# Patient Record
Sex: Male | Born: 1955 | Race: White | Hispanic: No | Marital: Married | Smoking: Never smoker
Health system: Southern US, Community
[De-identification: ages and names within clinical notes are randomized; demographics above are authoritative.]

## PROBLEM LIST (undated history)

## (undated) DIAGNOSIS — N400 Enlarged prostate without lower urinary tract symptoms: Secondary | ICD-10-CM

## (undated) DIAGNOSIS — H409 Unspecified glaucoma: Secondary | ICD-10-CM

## (undated) DIAGNOSIS — Z87442 Personal history of urinary calculi: Secondary | ICD-10-CM

## (undated) DIAGNOSIS — E7800 Pure hypercholesterolemia, unspecified: Secondary | ICD-10-CM

## (undated) DIAGNOSIS — K439 Ventral hernia without obstruction or gangrene: Secondary | ICD-10-CM

## (undated) DIAGNOSIS — R7303 Prediabetes: Secondary | ICD-10-CM

## (undated) DIAGNOSIS — E119 Type 2 diabetes mellitus without complications: Secondary | ICD-10-CM

## (undated) HISTORY — PX: COLONOSCOPY: SHX174

## (undated) HISTORY — PX: APPENDECTOMY: SHX54

## (undated) HISTORY — PX: EYE SURGERY: SHX253

## (undated) HISTORY — PX: HERNIA REPAIR: SHX51

---

## 2001-04-05 ENCOUNTER — Encounter: Admission: RE | Admit: 2001-04-05 | Discharge: 2001-04-05 | Payer: Self-pay | Admitting: Infectious Diseases

## 2004-12-18 ENCOUNTER — Ambulatory Visit: Payer: Self-pay | Admitting: Unknown Physician Specialty

## 2008-05-20 ENCOUNTER — Ambulatory Visit: Payer: Self-pay | Admitting: Internal Medicine

## 2008-05-25 ENCOUNTER — Ambulatory Visit: Payer: Self-pay | Admitting: Orthopedic Surgery

## 2009-10-08 ENCOUNTER — Ambulatory Visit: Payer: Self-pay | Admitting: Unknown Physician Specialty

## 2009-12-20 ENCOUNTER — Emergency Department: Payer: Self-pay | Admitting: Emergency Medicine

## 2010-08-06 ENCOUNTER — Ambulatory Visit: Payer: Self-pay | Admitting: Urology

## 2010-09-12 ENCOUNTER — Ambulatory Visit: Payer: Self-pay | Admitting: Urology

## 2011-12-16 DIAGNOSIS — Z87442 Personal history of urinary calculi: Secondary | ICD-10-CM

## 2011-12-16 HISTORY — PX: EXTRACORPOREAL SHOCK WAVE LITHOTRIPSY: SHX1557

## 2011-12-16 HISTORY — DX: Personal history of urinary calculi: Z87.442

## 2012-02-19 ENCOUNTER — Ambulatory Visit: Payer: Self-pay | Admitting: Urology

## 2012-03-05 ENCOUNTER — Ambulatory Visit: Payer: Self-pay | Admitting: Internal Medicine

## 2012-10-25 ENCOUNTER — Ambulatory Visit: Payer: Self-pay

## 2013-05-04 DIAGNOSIS — H401190 Primary open-angle glaucoma, unspecified eye, stage unspecified: Secondary | ICD-10-CM | POA: Insufficient documentation

## 2014-06-01 DIAGNOSIS — R739 Hyperglycemia, unspecified: Secondary | ICD-10-CM | POA: Insufficient documentation

## 2014-06-01 DIAGNOSIS — N400 Enlarged prostate without lower urinary tract symptoms: Secondary | ICD-10-CM | POA: Insufficient documentation

## 2016-07-21 DIAGNOSIS — E119 Type 2 diabetes mellitus without complications: Secondary | ICD-10-CM | POA: Insufficient documentation

## 2016-07-21 HISTORY — DX: Type 2 diabetes mellitus without complications: E11.9

## 2017-08-04 ENCOUNTER — Encounter: Payer: Self-pay | Admitting: *Deleted

## 2017-08-05 MED ORDER — LEVOFLOXACIN 500 MG PO TABS
500.0000 mg | ORAL_TABLET | ORAL | Status: AC
  Administered 2017-08-06: 500 mg via ORAL

## 2017-08-06 ENCOUNTER — Ambulatory Visit
Admission: RE | Admit: 2017-08-06 | Discharge: 2017-08-06 | Disposition: A | Discharge status: Home / Self Care | Payer: Commercial Managed Care - PPO | Source: Ambulatory Visit | Attending: Urology | Admitting: Urology

## 2017-08-06 ENCOUNTER — Encounter
Admission: RE | Disposition: A | Discharge status: Home / Self Care | Payer: Self-pay | Source: Ambulatory Visit | Attending: Urology

## 2017-08-06 DIAGNOSIS — N400 Enlarged prostate without lower urinary tract symptoms: Secondary | ICD-10-CM | POA: Diagnosis not present

## 2017-08-06 DIAGNOSIS — N201 Calculus of ureter: Secondary | ICD-10-CM | POA: Diagnosis not present

## 2017-08-06 DIAGNOSIS — Z7984 Long term (current) use of oral hypoglycemic drugs: Secondary | ICD-10-CM | POA: Insufficient documentation

## 2017-08-06 DIAGNOSIS — Z79899 Other long term (current) drug therapy: Secondary | ICD-10-CM | POA: Diagnosis not present

## 2017-08-06 DIAGNOSIS — E119 Type 2 diabetes mellitus without complications: Secondary | ICD-10-CM | POA: Diagnosis not present

## 2017-08-06 DIAGNOSIS — Z9049 Acquired absence of other specified parts of digestive tract: Secondary | ICD-10-CM | POA: Diagnosis not present

## 2017-08-06 DIAGNOSIS — H409 Unspecified glaucoma: Secondary | ICD-10-CM | POA: Insufficient documentation

## 2017-08-06 HISTORY — DX: Personal history of urinary calculi: Z87.442

## 2017-08-06 HISTORY — DX: Unspecified glaucoma: H40.9

## 2017-08-06 HISTORY — DX: Benign prostatic hyperplasia without lower urinary tract symptoms: N40.0

## 2017-08-06 HISTORY — PX: EXTRACORPOREAL SHOCK WAVE LITHOTRIPSY: SHX1557

## 2017-08-06 HISTORY — DX: Type 2 diabetes mellitus without complications: E11.9

## 2017-08-06 LAB — GLUCOSE, CAPILLARY: GLUCOSE-CAPILLARY: 117 mg/dL — AB (ref 65–99)

## 2017-08-06 SURGERY — LITHOTRIPSY, ESWL
Anesthesia: Moderate Sedation | Laterality: Left

## 2017-08-06 MED ORDER — DIPHENHYDRAMINE HCL 25 MG PO CAPS
ORAL_CAPSULE | ORAL | Status: AC
  Administered 2017-08-06: 25 mg
  Filled 2017-08-06: qty 1

## 2017-08-06 MED ORDER — PROMETHAZINE HCL 25 MG/ML IJ SOLN
INTRAMUSCULAR | Status: AC
  Administered 2017-08-06: 25 mg via INTRAMUSCULAR
  Filled 2017-08-06: qty 1

## 2017-08-06 MED ORDER — FUROSEMIDE 10 MG/ML IJ SOLN
INTRAMUSCULAR | Status: AC
  Administered 2017-08-06: 1 mg via INTRAVENOUS
  Filled 2017-08-06: qty 2

## 2017-08-06 MED ORDER — DIPHENHYDRAMINE HCL 25 MG PO CAPS: 25.0000 mg | ORAL_CAPSULE | ORAL | Status: DC

## 2017-08-06 MED ORDER — ONDANSETRON 8 MG PO TBDP: 4.0000 mg | ORAL_TABLET | Freq: Four times a day (QID) | ORAL | 3 refills | Status: DC | PRN

## 2017-08-06 MED ORDER — MIDAZOLAM HCL 2 MG/2ML IJ SOLN
INTRAMUSCULAR | Status: AC
  Administered 2017-08-06: 1 mg via INTRAMUSCULAR
  Filled 2017-08-06: qty 2

## 2017-08-06 MED ORDER — MIDAZOLAM HCL 2 MG/2ML IJ SOLN
1.0000 mg | Freq: Once | INTRAMUSCULAR | Status: AC
  Administered 2017-08-06: 1 mg via INTRAMUSCULAR

## 2017-08-06 MED ORDER — MORPHINE SULFATE (PF) 10 MG/ML IV SOLN
INTRAVENOUS | Status: AC
  Administered 2017-08-06: 10 mg via INTRAMUSCULAR
  Filled 2017-08-06: qty 1

## 2017-08-06 MED ORDER — FUROSEMIDE 10 MG/ML IJ SOLN
10.0000 mg | Freq: Once | INTRAMUSCULAR | Status: AC
  Administered 2017-08-06: 1 mg via INTRAVENOUS

## 2017-08-06 MED ORDER — LEVOFLOXACIN 500 MG PO TABS
ORAL_TABLET | ORAL | Status: AC
  Administered 2017-08-06: 500 mg via ORAL
  Filled 2017-08-06: qty 1

## 2017-08-06 MED ORDER — DEXTROSE-NACL 5-0.45 % IV SOLN
INTRAVENOUS | Status: DC
  Administered 2017-08-06: 12:00:00 via INTRAVENOUS

## 2017-08-06 MED ORDER — NUCYNTA 50 MG PO TABS: 50.0000 mg | ORAL_TABLET | Freq: Four times a day (QID) | ORAL | 0 refills | Status: DC | PRN

## 2017-08-06 MED ORDER — MORPHINE SULFATE (PF) 10 MG/ML IV SOLN
10.0000 mg | Freq: Once | INTRAVENOUS | Status: AC
  Administered 2017-08-06: 10 mg via INTRAMUSCULAR

## 2017-08-06 MED ORDER — CIPROFLOXACIN HCL 500 MG PO TABS: 500.0000 mg | ORAL_TABLET | Freq: Two times a day (BID) | ORAL | 0 refills | Status: DC

## 2017-08-06 MED ORDER — PROMETHAZINE HCL 25 MG/ML IJ SOLN
25.0000 mg | Freq: Once | INTRAMUSCULAR | Status: AC
  Administered 2017-08-06: 25 mg via INTRAMUSCULAR

## 2017-08-06 NOTE — Discharge Instructions (Signed)
Kidney Stones Kidney stones (urolithiasis) are rock-like masses that form inside of the kidneys. Kidneys are organs that make pee (urine). A kidney stone can cause very bad pain and can block the flow of pee. The stone usually leaves your body (passes) through your pee. You may need to have a doctor take out the stone. Follow these instructions at home: Eating and drinking  Drink enough fluid to keep your pee clear or pale yellow. This will help you pass the stone.  If told by your doctor, change the foods you eat (your diet). This may include: ? Limiting how much salt (sodium) you eat. ? Eating more fruits and vegetables. ? Limiting how much meat, poultry, fish, and eggs you eat.  Follow instructions from your doctor about eating or drinking restrictions. General instructions  Collect pee samples as told by your doctor. You may need to collect a pee sample: ? 24 hours after a stone comes out. ? 8-12 weeks after a stone comes out, and every 6-12 months after that.  Strain your pee every time you pee (urinate), for as long as told. Use the strainer that your doctor recommends.  Do not throw out the stone. Keep it so that it can be tested by your doctor.  Take over-the-counter and prescription medicines only as told by your doctor.  Keep all follow-up visits as told by your doctor. This is important. You may need follow-up tests. Preventing kidney stones To prevent another kidney stone:  Drink enough fluid to keep your pee clear or pale yellow. This is the best way to prevent kidney stones.  Eat healthy foods.  Avoid certain foods as told by your doctor. You may be told to eat less protein.  Stay at a healthy weight.  Contact a doctor if:  You have pain that gets worse or does not get better with medicine. Get help right away if:  You have a fever or chills.  You get very bad pain.  You get new pain in your belly (abdomen).  You pass out (faint).  You cannot pee. This  information is not intended to replace advice given to you by your health care provider. Make sure you discuss any questions you have with your health care provider. Document Released: 05/19/2008 Document Revised: 08/19/2016 Document Reviewed: 08/19/2016 Elsevier Interactive Patient Education  2017 [employer]   Kidney Stones Kidney stones (urolithiasis) are solid, rock-like deposits that form inside of the organs that make urine (kidneys). A kidney stone may form in a kidney and move into the bladder, where it can cause intense pain and block the flow of urine. Kidney stones are created when high levels of certain minerals are found in the urine. They are usually passed through urination, but in some cases, medical treatment may be needed to remove them. What are the causes? Kidney stones may be caused by:  A condition in which certain glands produce too much parathyroid hormone (primary hyperparathyroidism), which causes too much calcium buildup in the blood.  Buildup of uric acid crystals in the bladder (hyperuricosuria). Uric acid is a chemical that the body produces when you eat certain foods. It usually exits the body in the urine.  Narrowing (stricture) of one or both of the tubes that drain urine from the kidneys to the bladder (ureters).  A kidney blockage that is present at birth (congenital obstruction).  Past surgery on the kidney or the ureters, such as gastric bypass surgery.  What increases the risk? The following  factors make you more likely to develop kidney stones:  Having had a kidney stone in the past.  Having a family history of kidney stones.  Not drinking enough water.  Eating a diet that is high in protein, salt (sodium), or sugar.  Being overweight or obese.  What are the signs or symptoms? Symptoms of a kidney stone may include:  Nausea.  Vomiting.  Blood in the urine (hematuria).  Pain in the side of the abdomen, right below the ribs (flank  pain). Pain usually spreads (radiates) to the groin.  Needing to urinate frequently or urgently.  How is this diagnosed? This condition may be diagnosed based on:  Your medical history.  A physical exam.  Blood tests.  Urine tests.  CT scan.  Abdominal X-ray.  A procedure to examine the inside of the bladder (cystoscopy).  How is this treated? Treatment for kidney stones depends on the size, location, and makeup of the stones. Treatment may involve:  Analyzing your urine before and after you pass the stone through urination.  Being monitored at the hospital until you pass the stone through urination.  Increasing your fluid intake and decreasing the amount of calcium and protein in your diet.  A procedure to break up kidney stones in the bladder using: ? A focused beam of light (laser therapy). ? Shock waves (extracorporeal shock wave lithotripsy).  Surgery to remove kidney stones. This may be needed if you have severe pain or have stones that block your urinary tract.  Follow these instructions at home: Eating and drinking   Drink enough fluid to keep your urine clear or pale yellow. This will help you to pass the kidney stone.  If directed, change your diet. This may include: ? Limiting how much sodium you eat. ? Eating more fruits and vegetables. ? Limiting how much meat, poultry, fish, and eggs you eat.  Follow instructions from your health care provider about eating or drinking restrictions. General instructions  Collect urine samples as told by your health care provider. You may need to collect a urine sample: ? 24 hours after you pass the stone. ? 8-12 weeks after passing the kidney stone, and every 6-12 months after that.  Strain your urine every time you urinate, for as long as directed. Use the strainer that your health care provider recommends.  Do not throw out the kidney stone after passing it. Keep the stone so it can be tested by your health care  provider. Testing the makeup of your kidney stone may help prevent you from getting kidney stones in the future.  Take over-the-counter and prescription medicines only as told by your health care provider.  Keep all follow-up visits as told by your health care provider. This is important. You may need follow-up X-rays or ultrasounds to make sure that your stone has passed. How is this prevented? To prevent another kidney stone:  Drink enough fluid to keep your urine clear or pale yellow. This is the best way to prevent kidney stones.  Eat a healthy diet and follow recommendations from your health care provider about foods to avoid. You may be instructed to eat a low-protein diet. Recommendations vary depending on the type of kidney stone that you have.  Maintain a healthy weight.  Contact a health care provider if:  You have pain that gets worse or does not get better with medicine. Get help right away if:  You have a fever or chills.  You develop severe pain.  You develop new abdominal pain.  You faint.  You are unable to urinate. This information is not intended to replace advice given to you by your health care provider. Make sure you discuss any questions you have with your health care provider. Document Released: 12/01/2005 Document Revised: 06/20/2016 Document Reviewed: 05/16/2016 Elsevier Interactive Patient Education  2017 [employer]   Lithotripsy Lithotripsy is a treatment that can sometimes help eliminate kidney stones and the pain that they cause. A form of lithotripsy, also known as extracorporeal shock wave lithotripsy, is a nonsurgical procedure that crushes a kidney stone with shock waves. These shock waves pass through your body and focus on the kidney stone. They cause the kidney stone to break up while it is still in the urinary tract. This makes it easier for the smaller pieces of stone to pass in the urine. Tell a health care provider about:  Any allergies  you have.  All medicines you are taking, including vitamins, herbs, eye drops, creams, and over-the-counter medicines.  Any blood disorders you have.  Any surgeries you have had.  Any medical conditions you have.  Whether you are pregnant or may be pregnant.  Any problems you or family members have had with anesthetic medicines. What are the risks? Generally, this is a safe procedure. However, problems may occur, including:  Infection.  Bleeding of the kidney.  Bruising of the kidney or skin.  Scarring of the kidney, which can lead to: ? Increased blood pressure. ? Poor kidney function. ? Return (recurrence) of kidney stones.  Damage to other structures or organs, such as the liver, colon, spleen, or pancreas.  Blockage (obstruction) of the the tube that carries urine from the kidney to the bladder (ureter).  Failure of the kidney stone to break into pieces (fragments).  What happens before the procedure? Staying hydrated Follow instructions from your health care provider about hydration, which may include:  Up to 2 hours before the procedure - you may continue to drink clear liquids, such as water, clear fruit juice, black coffee, and plain tea.  Eating and drinking restrictions Follow instructions from your health care provider about eating and drinking, which may include:  8 hours before the procedure - stop eating heavy meals or foods such as meat, fried foods, or fatty foods.  6 hours before the procedure - stop eating light meals or foods, such as toast or cereal.  6 hours before the procedure - stop drinking milk or drinks that contain milk.  2 hours before the procedure - stop drinking clear liquids.  General instructions  Plan to have someone take you home from the hospital or clinic.  Ask your health care provider about: ? Changing or stopping your regular medicines. This is especially important if you are taking diabetes medicines or blood  thinners. ? Taking medicines such as aspirin and ibuprofen. These medicines and other NSAIDs can thin your blood. Do not take these medicines for 7 days before your procedure if your health care provider instructs you not to.  You may have tests, such as: ? Blood tests. ? Urine tests. ? Imaging tests, such as a CT scan. What happens during the procedure?  To lower your risk of infection: ? Your health care team will wash or sanitize their hands. ? Your skin will be washed with soap.  An IV tube will be inserted into one of your veins. This tube will give you fluids and medicines.  You will be given one or more  of the following: ? A medicine to help you relax (sedative). ? A medicine to make you fall asleep (general anesthetic).  A water-filled cushion may be placed behind your kidney or on your abdomen. In some cases you may be placed in a tub of lukewarm water.  Your body will be positioned in a way that makes it easy to target the kidney stone.  A flexible tube with holes in it (stent) may be placed in the ureter. This will help keep urine flowing from the kidney if the fragments of the stone have been blocking the ureter.  An X-ray or ultrasound exam will be done to locate your stone.  Shock waves will be aimed at the stone. If you are awake, you may feel a tapping sensation as the shock waves pass through your body. The procedure may vary among health care providers and hospitals. What happens after the procedure?  You may have an X-ray to see whether the procedure was able to break up the kidney stone and how much of the stone has passed. If large stone fragments remain after treatment, you may need to have a second procedure at a later time.  Your blood pressure, heart rate, breathing rate, and blood oxygen level will be monitored until the medicines you were given have worn off.  You may be given antibiotics or pain medicine as needed.  If a stent was placed in your ureter  during surgery, it may stay in place for a few weeks.  You may need strain your urine to collect pieces of the kidney stone for testing.  You will need to drink plenty of water.  Do not drive for 24 hours if you were given a sedative. Summary  Lithotripsy is a treatment that can sometimes help eliminate kidney stones and the pain that they cause.  A form of lithotripsy, also known as extracorporeal shock wave lithotripsy, is a nonsurgical procedure that crushes a kidney stone with shock waves.  Generally, this is a safe procedure. However, problems may occur, including damage to the kidney or other organs, infection, or obstruction of the tube that carries urine from the kidney to the bladder (ureter).  When you go home, you will need to drink plenty of water. You may be asked to strain your urine to collect pieces of the kidney stone for testing. This information is not intended to replace advice given to you by your health care provider. Make sure you discuss any questions you have with your health care provider. Document Released: 11/28/2000 Document Revised: 10/22/2016 Document Reviewed: 10/22/2016 Elsevier Interactive Patient Education  2017 [employer]   Lithotripsy, Care After This sheet gives you information about how to care for yourself after your procedure. Your health care provider may also give you more specific instructions. If you have problems or questions, contact your health care provider. What can I expect after the procedure? After the procedure, it is common to have:  Some blood in your urine. This should only last for a few days.  Soreness in your back, sides, or upper abdomen for a few days.  Blotches or bruises on your back where the pressure wave entered the skin.  Pain, discomfort, or nausea when pieces (fragments) of the kidney stone move through the tube that carries urine from the kidney to the bladder (ureter). Stone fragments may pass soon after the  procedure, but they may continue to pass for up to 4-8 weeks. ? If you have severe pain or  nausea, contact your health care provider. This may be caused by a large stone that was not broken up, and this may mean that you need more treatment.  Some pain or discomfort during urination.  Some pain or discomfort in the lower abdomen or (in men) at the base of the penis.  Follow these instructions at home: Medicines  Take over-the-counter and prescription medicines only as told by your health care provider.  If you were prescribed an antibiotic medicine, take it as told by your health care provider. Do not stop taking the antibiotic even if you start to feel better.  Do not drive for 24 hours if you were given a medicine to help you relax (sedative).  Do not drive or use heavy machinery while taking prescription pain medicine. Eating and drinking  Drink enough water and fluids to keep your urine clear or pale yellow. This helps any remaining pieces of the stone to pass. It can also help prevent new stones from forming.  Eat plenty of fresh fruits and vegetables.  Follow instructions from your health care provider about eating and drinking restrictions. You may be instructed: ? To reduce how much salt (sodium) you eat or drink. Check ingredients and nutrition facts on packaged foods and beverages. ? To reduce how much meat you eat.  Eat the recommended amount of calcium for your age and gender. Ask your health care provider how much calcium you should have. General instructions  Get plenty of rest.  Most people can resume normal activities 1-2 days after the procedure. Ask your health care provider what activities are safe for you.  If directed, strain all urine through the strainer that was provided by your health care provider. ? Keep all fragments for your health care provider to see. Any stones that are found may be sent to a medical lab for examination. The stone may be as small as  a grain of salt.  Keep all follow-up visits as told by your health care provider. This is important. Contact a health care provider if:  You have pain that is severe or does not get better with medicine.  You have nausea that is severe or does not go away.  You have blood in your urine longer than your health care provider told you to expect.  You have more blood in your urine.  You have pain during urination that does not go away.  You urinate more frequently than usual and this does not go away.  You develop a rash or any other possible signs of an allergic reaction. Get help right away if:  You have severe pain in your back, sides, or upper abdomen.  You have severe pain while urinating.  Your urine is very dark red.  You have blood in your stool (feces).  You cannot pass any urine at all.  You feel a strong urge to urinate after emptying your bladder.  You have a fever or chills.  You develop shortness of breath, difficulty breathing, or chest pain.  You have severe nausea that leads to persistent vomiting.  You faint. Summary  After this procedure, it is common to have some pain, discomfort, or nausea when pieces (fragments) of the kidney stone move through the tube that carries urine from the kidney to the bladder (ureter). If this pain or nausea is severe, however, you should contact your health care provider.  Most people can resume normal activities 1-2 days after the procedure. Ask your health care provider  what activities are safe for you.  Drink enough water and fluids to keep your urine clear or pale yellow. This helps any remaining pieces of the stone to pass, and it can help prevent new stones from forming.  If directed, strain your urine and keep all fragments for your health care provider to see. Fragments or stones may be as small as a grain of salt.  Get help right away if you have severe pain in your back, sides, or upper abdomen or have severe  pain while urinating. This information is not intended to replace advice given to you by your health care provider. Make sure you discuss any questions you have with your health care provider. Document Released: 12/21/2007 Document Revised: 10/22/2016 Document Reviewed: 10/22/2016 Elsevier Interactive Patient Education  2017 [employer].

## 2017-08-06 NOTE — OR Nursing (Signed)
Transported to to [facility] truck via wheelchair.

## 2017-08-07 ENCOUNTER — Encounter: Payer: Self-pay | Admitting: Urology

## 2018-02-09 DIAGNOSIS — N200 Calculus of kidney: Secondary | ICD-10-CM | POA: Insufficient documentation

## 2019-01-06 ENCOUNTER — Other Ambulatory Visit: Payer: Self-pay | Admitting: Urology

## 2019-01-06 DIAGNOSIS — R1032 Left lower quadrant pain: Secondary | ICD-10-CM

## 2019-01-12 ENCOUNTER — Ambulatory Visit: Admission: RE | Admit: 2019-01-12 | Payer: Commercial Managed Care - PPO | Source: Ambulatory Visit

## 2019-01-13 ENCOUNTER — Ambulatory Visit
Admission: RE | Admit: 2019-01-13 | Discharge: 2019-01-13 | Disposition: A | Discharge status: Home / Self Care | Payer: Commercial Managed Care - PPO | Source: Ambulatory Visit | Attending: Urology | Admitting: Urology

## 2019-01-13 DIAGNOSIS — R1032 Left lower quadrant pain: Secondary | ICD-10-CM | POA: Diagnosis not present

## 2019-01-13 LAB — POCT I-STAT CREATININE: Creatinine, Ser: 0.8 mg/dL (ref 0.61–1.24)

## 2019-01-13 MED ORDER — IOPAMIDOL (ISOVUE-300) INJECTION 61%
100.0000 mL | Freq: Once | INTRAVENOUS | Status: AC | PRN
  Administered 2019-01-13: 100 mL via INTRAVENOUS

## 2019-03-06 ENCOUNTER — Ambulatory Visit
Admission: EM | Admit: 2019-03-06 | Discharge: 2019-03-06 | Disposition: A | Discharge status: Home / Self Care | Payer: Commercial Managed Care - PPO | Attending: Family Medicine | Admitting: Family Medicine

## 2019-03-06 ENCOUNTER — Other Ambulatory Visit: Payer: Self-pay

## 2019-03-06 ENCOUNTER — Encounter: Payer: Self-pay | Admitting: Gynecology

## 2019-03-06 DIAGNOSIS — J0100 Acute maxillary sinusitis, unspecified: Secondary | ICD-10-CM

## 2019-03-06 HISTORY — DX: Pure hypercholesterolemia, unspecified: E78.00

## 2019-03-06 MED ORDER — AMOXICILLIN-POT CLAVULANATE 875-125 MG PO TABS: 1.0000 | ORAL_TABLET | Freq: Two times a day (BID) | ORAL | 0 refills | Status: DC

## 2019-03-06 NOTE — ED Triage Notes (Signed)
Patient c/o sinus infection x 1 week 

## 2019-03-06 NOTE — ED Provider Notes (Signed)
[facility] ____________________________________________  Time seen: Approximately 11:39 AM  I have reviewed the triage vital signs and the nursing notes.   HISTORY  Chief Complaint No chief complaint on file.   HPI Brandon Moon is a 63 y.o. male presenting for 1-1.5 weeks of nasal congestion and nasal drainage and sneezing.  States over the last week he has had increased sinus congestion and sinus pressure.  States pressure around his cheeks.  States last night he was blowing his nose which she has been doing frequently, and states he had some blood in his nasal drainage.  Denies hemoptysis.  Denies fevers.  Does report history of seasonal allergies.  Has been taken loratadine.  Denies chest pain, shortness of breath, abdominal pain.  Denies recent sickness.  Denies known sick contacts.  No recent travel.  Denies other aggravating leaving factors.  Was otherwise doing well.    Past Medical History:  Diagnosis Date  . BPH (benign prostatic hyperplasia)   . Diabetes mellitus without complication (HCC)    type 2  . Glaucoma   . High cholesterol   . History of kidney stones 2013   eswl at that time    There are no active problems to display for this patient.   Past Surgical History:  Procedure Laterality Date  . APPENDECTOMY    . COLONOSCOPY    . EXTRACORPOREAL SHOCK WAVE LITHOTRIPSY  2013  . EXTRACORPOREAL SHOCK WAVE LITHOTRIPSY Left 08/06/2017   Procedure: EXTRACORPOREAL SHOCK WAVE LITHOTRIPSY (ESWL);  Surgeon: Orson Ape, MD;  Location: [facility] ORS;  Service: Urology;  Laterality: Left;  . EYE SURGERY       No current facility-administered medications for this encounter.   Current Outpatient Medications:  .  brimonidine (ALPHAGAN) 0.2 % ophthalmic solution, Place 1 drop into both eyes 2 (two) times daily., Disp: , Rfl:  .  dorzolamide (TRUSOPT) 2 % ophthalmic solution, Place 1 drop into both eyes 2 (two) times daily., Disp: , Rfl:  .  finasteride  (PROSCAR) 5 MG tablet, Take 5 mg by mouth daily., Disp: , Rfl:  .  latanoprost (XALATAN) 0.005 % ophthalmic solution, Place 1 drop into both eyes at bedtime., Disp: , Rfl:  .  metFORMIN (GLUCOPHAGE) 500 MG tablet, Take by mouth daily with breakfast., Disp: , Rfl:  .  NUCYNTA 50 MG tablet, Take 1 tablet (50 mg total) by mouth every 6 (six) hours as needed for moderate pain. 1 TO 2 TABS Q 6 HOURS PRN PAIN, Disp: 20 tablet, Rfl: 0 .  amoxicillin-clavulanate (AUGMENTIN) 875-125 MG tablet, Take 1 tablet by mouth every 12 (twelve) hours., Disp: 20 tablet, Rfl: 0 .  atorvastatin (LIPITOR) 10 MG tablet, , Disp: , Rfl:  .  ciprofloxacin (CIPRO) 500 MG tablet, Take 1 tablet (500 mg total) by mouth 2 (two) times daily., Disp: 6 tablet, Rfl: 0 .  ondansetron (ZOFRAN ODT) 8 MG disintegrating tablet, Take 0.5 tablets (4 mg total) by mouth every 6 (six) hours as needed for nausea or vomiting., Disp: 4 tablet, Rfl: 3 .  Ondansetron (ZUPLENZ) 4 MG FILM, Take 4 mg by mouth 3 (three) times daily as needed., Disp: , Rfl:  .  tamsulosin (FLOMAX) 0.4 MG CAPS capsule, Take 0.4 mg by mouth daily., Disp: , Rfl:  .  tapentadol (NUCYNTA) 50 MG tablet, Take 50 mg by mouth every 4 (four) hours as needed for moderate pain., Disp: , Rfl:   Allergies Aspirin  Family History  Problem Relation Age of Onset  .  Cancer Mother   . Cancer Father     Social History Social History   Tobacco Use  . Smoking status: Never Smoker  . Smokeless tobacco: Never Used  Substance Use Topics  . Alcohol use: No  . Drug use: Never    Review of Systems Constitutional: No fever ENT: No sore throat. Positive nasal congestion.  Cardiovascular: Denies chest pain. Respiratory: Denies shortness of breath. Gastrointestinal: No abdominal pain.  Musculoskeletal: Negative for back pain. Skin: Negative for rash. .  ____________________________________________   PHYSICAL EXAM:  VITAL SIGNS: ED Triage Vitals [03/06/19 1048]  Enc Vitals  Group     BP (!) 138/93     Pulse Rate 76     Resp 18     Temp 98 F (36.7 C)     Temp Source Oral     SpO2 99 %     Weight 183 lb (83 kg)     Height  (1.727 m)     Head Circumference      Peak Flow      Pain Score 4     Pain Loc      Pain Edu?      Excl. in GC?     Constitutional: Alert and oriented. Well appearing and in no acute distress. Eyes: Conjunctivae are normal.  Head: Atraumatic.Mild to moderate tenderness to palpation bilateral maxillary sinuses.  No frontal sinus tenderness palpation.  No swelling. No erythema.   Ears: no erythema, normal TMs bilaterally.   Nose: nasal congestion with bilateral nasal turbinate erythema and edema.  No epistaxis or dried blood.   Mouth/Throat: Mucous membranes are moist.  Oropharynx non-erythematous.No tonsillar swelling or exudate.  Neck: No stridor.  No cervical spine tenderness to palpation. Hematological/Lymphatic/Immunilogical: No cervical lymphadenopathy. Cardiovascular: Normal rate, regular rhythm. Grossly normal heart sounds.  Good peripheral circulation. Respiratory: Normal respiratory effort.  No retractions.  No wheezes, rales or rhonchi. Good air movement.  Musculoskeletal: Steady gait.  Neurologic:  Normal speech and language. No gait instability. Skin:  Skin is warm, dry and intact. No rash noted. Psychiatric: Mood and affect are normal. Speech and behavior are normal.  ___________________________________________   LABS (all labs ordered are listed, but only abnormal results are displayed)  Labs Reviewed - No data to display ____________________________________________  PROCEDURES Procedures    INITIAL IMPRESSION / ASSESSMENT AND PLAN / ED COURSE  Pertinent labs & imaging results that were available during my care of the patient were reviewed by me and considered in my medical decision making (see chart for details).  Well-appearing patient.  No acute distress.  Suspect recent seasonal allergies with  secondary sinusitis.  Will treat with oral Augmentin.  Continue home antihistamine and over-the-counter Mucinex.  Supportive care.Discussed indication, risks and benefits of medications with patient.  Discussed follow up with Primary care physician this week. Discussed follow up and return parameters including no resolution or any worsening concerns. Patient verbalized understanding and agreed to plan.   ____________________________________________   FINAL CLINICAL IMPRESSION(S) / ED DIAGNOSES  Final diagnoses:  Acute maxillary sinusitis, recurrence not specified     ED Discharge Orders         Ordered    amoxicillin-clavulanate (AUGMENTIN) 875-125 MG tablet  Every 12 hours     03/06/19 1117           Note: This dictation was prepared with Dragon dictation along with smaller phrase technology. Any transcriptional errors that result from this process are unintentional.  Renford Dills, NP 03/06/19 1227

## 2019-03-06 NOTE — Discharge Instructions (Addendum)
Take medication as prescribed. Rest. Drink plenty of fluids. Over the counter medication as discussed.  ° °Follow up with your primary care physician this week as needed. Return to Urgent care for new or worsening concerns.  ° °

## 2019-06-09 DIAGNOSIS — G8929 Other chronic pain: Secondary | ICD-10-CM | POA: Insufficient documentation

## 2019-06-09 DIAGNOSIS — K5909 Other constipation: Secondary | ICD-10-CM | POA: Insufficient documentation

## 2019-07-25 ENCOUNTER — Ambulatory Visit: Payer: Self-pay | Admitting: Surgery

## 2019-07-25 NOTE — H&P (View-Only) (Signed)
Subjective:   CC: Non-recurrent unilateral inguinal hernia without obstruction or gangrene [K40.90]  HPI:  Brandon Moon is a 63 y.o. male who was referred by Rowan BlaseKimberly Ann Mills, NP for evaluation of above. Symptoms were first noted 1 year ago. Pain is sharp and intermittent, radiating from the left lower quadrant, to the left leg and back.  Associated with lump, exacerbated by nothing specific.  Lump is reducible. Patient has no symptoms of  chronic cough, difficulty urinating.    Past Medical History:  has a past medical history of BPH (benign prostatic hyperplasia), Chronic LLQ pain (06/09/2019), Diabetes mellitus type 2, uncomplicated (CMS-HCC), Glaucoma (increased eye pressure), Hypertension, Kidney stone, and Other constipation (06/09/2019).  Past Surgical History:       Past Surgical History:  Procedure Laterality Date  . COLONOSCOPY  04/14/2001   Adenomatous Polyp, [facility] (Mother)  . COLONOSCOPY  11/28/2014, 10/08/2009, 12/18/2004   PH Adenomatous Polyp, [facility] (Mother): CBF 11/2019  . COLONOSCOPY  06/23/2019   Negative colon biopsy/Repeat 8757yrs/TKT  . EGD  01/12/1987  . GLAUCOMA EYE SURGERY Left    LPI superiorly  . lithrotripsy      Family History: family history is not on file.  Social History:  reports that he has never smoked. He has never used smokeless tobacco. He reports that he does not drink alcohol or use drugs.  Current Medications: has a current medication list which includes the following prescription(s): alphagan p, aspirin, atorvastatin, dorzolamide-timolol, finasteride, latanoprost, metformin, and multivitamin.  Allergies:       Allergies as of 07/11/2019 - Reviewed 07/11/2019  Allergen Reaction Noted  . Aspirin Other (See Comments)     ROS:  A 15 point review of systems was performed and pertinent positives and negatives noted in HPI   Objective:   BP (!) 144/93   Pulse 69   Ht 170.2 cm (5\' 7" )   Wt 83 kg (183 lb)   BMI  28.66 kg/m   Constitutional :  alert, appears stated age, cooperative and no distress  Lymphatics/Throat:  no asymmetry, masses, or scars  Respiratory:  clear to auscultation bilaterally  Cardiovascular:  regular rate and rhythm  Gastrointestinal: soft, non-tender; bowel sounds normal; no masses,  no organomegaly. No obvious hernia noted on exam today.  Musculoskeletal: Steady gait and movement  Skin: Cool and moist, visible surgical scars paramedian on right side, for former appendectomy  Psychiatric: Normal affect, non-agitated, not confused       LABS:  n/a   RADS: CLINICAL DATA: 2 month history of left lower quadrant pain.  EXAM: CT ABDOMEN AND PELVIS WITHOUT AND WITH CONTRAST  TECHNIQUE: Multidetector CT imaging of the abdomen and pelvis was performed following the standard protocol before and following the bolus administration of intravenous contrast.  CONTRAST: 100mL ISOVUE-300 IOPAMIDOL (ISOVUE-300) INJECTION 61%  COMPARISON: 09/12/2010  FINDINGS: Lower chest: Unremarkable.  Hepatobiliary: 10 mm low-density lesion in the left liver is stable since prior study compatible with benign process such as cyst. 2nd tiny low-density lesion in posterior segment IV is also unchanged. There is no evidence for gallstones, gallbladder wall thickening, or pericholecystic fluid. No intrahepatic or extrahepatic biliary dilation.  Pancreas: No focal mass lesion. No dilatation of the main duct. No intraparenchymal cyst. No peripancreatic edema.  Spleen: No splenomegaly. No focal mass lesion.  Adrenals/Urinary Tract: No adrenal nodule or mass. Tiny hypoattenuating cortical lesions in the right kidney are too small to characterize but likely cysts. Left kidney unremarkable. No evidence for hydroureter. The  urinary bladder appears normal for the degree of distention.  Stomach/Bowel: Stomach is unremarkable. No gastric wall thickening. No evidence of outlet obstruction.  Duodenum is normally positioned as is the ligament of Treitz. No small bowel wall thickening. No small bowel dilatation. The terminal ileum is normal. The appendix is not visualized, but there is no edema or inflammation in the region of the cecum. No gross colonic mass. No colonic wall thickening.  Vascular/Lymphatic: No abdominal aortic aneurysm. No abdominal aortic atherosclerotic calcification. There is no gastrohepatic or hepatoduodenal ligament lymphadenopathy. No intraperitoneal or retroperitoneal lymphadenopathy. No pelvic sidewall lymphadenopathy.  Reproductive: The prostate gland and seminal vesicles are unremarkable.  Other: No intraperitoneal free fluid.  Musculoskeletal: Small left groin hernia contains only fat. No worrisome lytic or sclerotic osseous abnormality.  IMPRESSION: 1. No acute findings in the abdomen or pelvis. No findings to explain the history of left lower quadrant pain. 2. Stable tiny hepatic cysts. 3. Small left groin hernia contains only fat.   Electronically Signed  By: Misty Stanley M.D.  On: 01/13/2019 18:48  Other Result Information  Interface, Rad Results In - 01/13/2019  6:50 PM EST CLINICAL DATA:  2 month history of left lower quadrant pain.  EXAM: CT ABDOMEN AND PELVIS WITHOUT AND WITH CONTRAST  TECHNIQUE: Multidetector CT imaging of the abdomen and pelvis was performed following the standard protocol before and following the bolus administration of intravenous contrast.  CONTRAST:  160mL ISOVUE-300 IOPAMIDOL (ISOVUE-300) INJECTION 61%  COMPARISON:  09/12/2010  FINDINGS: Lower chest: Unremarkable.  Hepatobiliary: 10 mm low-density lesion in the left liver is stable since prior study compatible with benign process such as cyst. 2nd tiny low-density lesion in posterior segment IV is also unchanged. There is no evidence for gallstones, gallbladder wall thickening, or pericholecystic fluid. No intrahepatic or extrahepatic  biliary dilation.  Pancreas: No focal mass lesion. No dilatation of the main duct. No intraparenchymal cyst. No peripancreatic edema.  Spleen: No splenomegaly. No focal mass lesion.  Adrenals/Urinary Tract: No adrenal nodule or mass. Tiny hypoattenuating cortical lesions in the right kidney are too small to characterize but likely cysts. Left kidney unremarkable. No evidence for hydroureter. The urinary bladder appears normal for the degree of distention.  Stomach/Bowel: Stomach is unremarkable. No gastric wall thickening. No evidence of outlet obstruction. Duodenum is normally positioned as is the ligament of Treitz. No small bowel wall thickening. No small bowel dilatation. The terminal ileum is normal. The appendix is not visualized, but there is no edema or inflammation in the region of the cecum. No gross colonic mass. No colonic wall thickening.  Vascular/Lymphatic: No abdominal aortic aneurysm. No abdominal aortic atherosclerotic calcification. There is no gastrohepatic or hepatoduodenal ligament lymphadenopathy. No intraperitoneal or retroperitoneal lymphadenopathy. No pelvic sidewall lymphadenopathy.  Reproductive: The prostate gland and seminal vesicles are unremarkable.  Other: No intraperitoneal free fluid.  Musculoskeletal: Small left groin hernia contains only fat. No worrisome lytic or sclerotic osseous abnormality.  IMPRESSION: 1. No acute findings in the abdomen or pelvis. No findings to explain the history of left lower quadrant pain. 2. Stable tiny hepatic cysts. 3. Small left groin hernia contains only fat.   Electronically Signed   By: Misty Stanley M.D.   On: 01/13/2019 18:48    Assessment:       Non-recurrent unilateral inguinal hernia without obstruction or gangrene [K40.90]  Plan:   1. Non-recurrent unilateral inguinal hernia without obstruction or gangrene [K40.90]   Discussed the risk of surgery including recurrence, which can be  up to 50% in the case of incisional or complex hernias, possible use of prosthetic materials (mesh) and the increased risk of mesh infxn if used, bleeding, chronic pain, post-op infxn, post-op SBO or ileus, and possible re-operation to address said risks. The risks of general anesthetic, if used, includes MI, CVA, sudden death or even reaction to anesthetic medications also discussed. Alternatives include continued observation.  Benefits include possible symptom relief, prevention of incarceration, strangulation, enlargement in size over time, and the risk of emergency surgery in the face of strangulation.   Typical post-op recovery time of 3-5 days with 4-6 weeks of activity restrictions were also discussed.  ED return precautions given for sudden increase in pain, size of hernia with accompanying fever, nausea, and/or vomiting.  The patient verbalized understanding and all questions were answered to the patient's satisfaction.   2. We will proceed with Xi robotic assisted lap inguinal repair, LEFT

## 2019-07-25 NOTE — H&P (Addendum)
Subjective:   CC: Non-recurrent unilateral inguinal hernia without obstruction or gangrene [K40.90]  HPI:  Brandon Moon is a 63 y.o. male who was referred by Rowan BlaseKimberly Ann Mills, NP for evaluation of above. Symptoms were first noted 1 year ago. Pain is sharp and intermittent, radiating from the left lower quadrant, to the left leg and back.  Associated with lump, exacerbated by nothing specific.  Lump is reducible. Patient has no symptoms of  chronic cough, difficulty urinating.    Past Medical History:  has a past medical history of BPH (benign prostatic hyperplasia), Chronic LLQ pain (06/09/2019), Diabetes mellitus type 2, uncomplicated (CMS-HCC), Glaucoma (increased eye pressure), Hypertension, Kidney stone, and Other constipation (06/09/2019).  Past Surgical History:       Past Surgical History:  Procedure Laterality Date  . COLONOSCOPY  04/14/2001   Adenomatous Polyp, [facility] (Mother)  . COLONOSCOPY  11/28/2014, 10/08/2009, 12/18/2004   PH Adenomatous Polyp, [facility] (Mother): [facility]BF 11/2019  . COLONOSCOPY  06/23/2019   Negative colon biopsy/Repeat 8757yrs/TKT  . EGD  01/12/1987  . GLAUCOMA EYE SURGERY Left    LPI superiorly  . lithrotripsy      Family History: family history is not on file.  Social History:  reports that he has never smoked. He has never used smokeless tobacco. He reports that he does not drink alcohol or use drugs.  Current Medications: has a current medication list which includes the following prescription(s): alphagan p, aspirin, atorvastatin, dorzolamide-timolol, finasteride, latanoprost, metformin, and multivitamin.  Allergies:       Allergies as of 07/11/2019 - Reviewed 07/11/2019  Allergen Reaction Noted  . Aspirin Other (See Comments)     ROS:  A 15 point review of systems was performed and pertinent positives and negatives noted in HPI   Objective:   BP (!) 144/93   Pulse 69   Ht 170.2 cm (5\' 7" )   Wt 83 kg (183 lb)   BMI  28.66 kg/m   Constitutional :  alert, appears stated age, cooperative and no distress  Lymphatics/Throat:  no asymmetry, masses, or scars  Respiratory:  clear to auscultation bilaterally  Cardiovascular:  regular rate and rhythm  Gastrointestinal: soft, non-tender; bowel sounds normal; no masses,  no organomegaly. No obvious hernia noted on exam today.  Musculoskeletal: Steady gait and movement  Skin: Cool and moist, visible surgical scars paramedian on right side, for former appendectomy  Psychiatric: Normal affect, non-agitated, not confused       LABS:  n/a   RADS: CLINICAL DATA: 2 month history of left lower quadrant pain.  EXAM: CT ABDOMEN AND PELVIS WITHOUT AND WITH CONTRAST  TECHNIQUE: Multidetector CT imaging of the abdomen and pelvis was performed following the standard protocol before and following the bolus administration of intravenous contrast.  CONTRAST: 100mL ISOVUE-300 IOPAMIDOL (ISOVUE-300) INJECTION 61%  COMPARISON: 09/12/2010  FINDINGS: Lower chest: Unremarkable.  Hepatobiliary: 10 mm low-density lesion in the left liver is stable since prior study compatible with benign process such as cyst. 2nd tiny low-density lesion in posterior segment IV is also unchanged. There is no evidence for gallstones, gallbladder wall thickening, or pericholecystic fluid. No intrahepatic or extrahepatic biliary dilation.  Pancreas: No focal mass lesion. No dilatation of the main duct. No intraparenchymal cyst. No peripancreatic edema.  Spleen: No splenomegaly. No focal mass lesion.  Adrenals/Urinary Tract: No adrenal nodule or mass. Tiny hypoattenuating cortical lesions in the right kidney are too small to characterize but likely cysts. Left kidney unremarkable. No evidence for hydroureter. The  urinary bladder appears normal for the degree of distention.  Stomach/Bowel: Stomach is unremarkable. No gastric wall thickening. No evidence of outlet obstruction.  Duodenum is normally positioned as is the ligament of Treitz. No small bowel wall thickening. No small bowel dilatation. The terminal ileum is normal. The appendix is not visualized, but there is no edema or inflammation in the region of the cecum. No gross colonic mass. No colonic wall thickening.  Vascular/Lymphatic: No abdominal aortic aneurysm. No abdominal aortic atherosclerotic calcification. There is no gastrohepatic or hepatoduodenal ligament lymphadenopathy. No intraperitoneal or retroperitoneal lymphadenopathy. No pelvic sidewall lymphadenopathy.  Reproductive: The prostate gland and seminal vesicles are unremarkable.  Other: No intraperitoneal free fluid.  Musculoskeletal: Small left groin hernia contains only fat. No worrisome lytic or sclerotic osseous abnormality.  IMPRESSION: 1. No acute findings in the abdomen or pelvis. No findings to explain the history of left lower quadrant pain. 2. Stable tiny hepatic cysts. 3. Small left groin hernia contains only fat.   Electronically Signed  By: Misty Stanley M.D.  On: 01/13/2019 18:48  Other Result Information  Interface, Rad Results In - 01/13/2019  6:50 PM EST CLINICAL DATA:  2 month history of left lower quadrant pain.  EXAM: CT ABDOMEN AND PELVIS WITHOUT AND WITH CONTRAST  TECHNIQUE: Multidetector CT imaging of the abdomen and pelvis was performed following the standard protocol before and following the bolus administration of intravenous contrast.  CONTRAST:  160mL ISOVUE-300 IOPAMIDOL (ISOVUE-300) INJECTION 61%  COMPARISON:  09/12/2010  FINDINGS: Lower chest: Unremarkable.  Hepatobiliary: 10 mm low-density lesion in the left liver is stable since prior study compatible with benign process such as cyst. 2nd tiny low-density lesion in posterior segment IV is also unchanged. There is no evidence for gallstones, gallbladder wall thickening, or pericholecystic fluid. No intrahepatic or extrahepatic  biliary dilation.  Pancreas: No focal mass lesion. No dilatation of the main duct. No intraparenchymal cyst. No peripancreatic edema.  Spleen: No splenomegaly. No focal mass lesion.  Adrenals/Urinary Tract: No adrenal nodule or mass. Tiny hypoattenuating cortical lesions in the right kidney are too small to characterize but likely cysts. Left kidney unremarkable. No evidence for hydroureter. The urinary bladder appears normal for the degree of distention.  Stomach/Bowel: Stomach is unremarkable. No gastric wall thickening. No evidence of outlet obstruction. Duodenum is normally positioned as is the ligament of Treitz. No small bowel wall thickening. No small bowel dilatation. The terminal ileum is normal. The appendix is not visualized, but there is no edema or inflammation in the region of the cecum. No gross colonic mass. No colonic wall thickening.  Vascular/Lymphatic: No abdominal aortic aneurysm. No abdominal aortic atherosclerotic calcification. There is no gastrohepatic or hepatoduodenal ligament lymphadenopathy. No intraperitoneal or retroperitoneal lymphadenopathy. No pelvic sidewall lymphadenopathy.  Reproductive: The prostate gland and seminal vesicles are unremarkable.  Other: No intraperitoneal free fluid.  Musculoskeletal: Small left groin hernia contains only fat. No worrisome lytic or sclerotic osseous abnormality.  IMPRESSION: 1. No acute findings in the abdomen or pelvis. No findings to explain the history of left lower quadrant pain. 2. Stable tiny hepatic cysts. 3. Small left groin hernia contains only fat.   Electronically Signed   By: Misty Stanley M.D.   On: 01/13/2019 18:48    Assessment:       Non-recurrent unilateral inguinal hernia without obstruction or gangrene [K40.90]  Plan:   1. Non-recurrent unilateral inguinal hernia without obstruction or gangrene [K40.90]   Discussed the risk of surgery including recurrence, which can be  up to 50% in the case of incisional or complex hernias, possible use of prosthetic materials (mesh) and the increased risk of mesh infxn if used, bleeding, chronic pain, post-op infxn, post-op SBO or ileus, and possible re-operation to address said risks. The risks of general anesthetic, if used, includes MI, CVA, sudden death or even reaction to anesthetic medications also discussed. Alternatives include continued observation.  Benefits include possible symptom relief, prevention of incarceration, strangulation, enlargement in size over time, and the risk of emergency surgery in the face of strangulation.   Typical post-op recovery time of 3-5 days with 4-6 weeks of activity restrictions were also discussed.  ED return precautions given for sudden increase in pain, size of hernia with accompanying fever, nausea, and/or vomiting.  The patient verbalized understanding and all questions were answered to the patient's satisfaction.   2. We will proceed with Xi robotic assisted lap inguinal repair, LEFT 

## 2019-08-12 ENCOUNTER — Other Ambulatory Visit: Payer: Self-pay

## 2019-08-12 ENCOUNTER — Encounter
Admission: RE | Admit: 2019-08-12 | Discharge: 2019-08-12 | Disposition: A | Discharge status: Home / Self Care | Payer: Commercial Managed Care - PPO | Source: Ambulatory Visit | Attending: Surgery | Admitting: Surgery

## 2019-08-12 DIAGNOSIS — K4090 Unilateral inguinal hernia, without obstruction or gangrene, not specified as recurrent: Secondary | ICD-10-CM | POA: Insufficient documentation

## 2019-08-12 DIAGNOSIS — E119 Type 2 diabetes mellitus without complications: Secondary | ICD-10-CM | POA: Insufficient documentation

## 2019-08-12 DIAGNOSIS — Z01818 Encounter for other preprocedural examination: Secondary | ICD-10-CM | POA: Insufficient documentation

## 2019-08-12 DIAGNOSIS — Z01810 Encounter for preprocedural cardiovascular examination: Secondary | ICD-10-CM | POA: Diagnosis not present

## 2019-08-12 HISTORY — DX: Ventral hernia without obstruction or gangrene: K43.9

## 2019-08-12 NOTE — Patient Instructions (Signed)
Your procedure is scheduled on: 08/19/2019 Fri Report to Same Day Surgery 2nd floor medical mall [facility](Medical Mall Entrance-take elevator on left to 2nd floor.  Check in with surgery information desk.) To find out your arrival time please call [phone] between 1PM - 3PM on 08/18/2019 Thurs  Remember: Instructions that are not followed completely may result in serious medical risk, up to and including death, or upon the discretion of your surgeon and anesthesiologist your surgery may need to be rescheduled.    _x___ 1. Do not eat food after midnight the night before your procedure. You may drink clear liquids up to 2 hours before you are scheduled to arrive at the hospital for your procedure.  Do not drink clear liquids within 2 hours of your scheduled arrival to the hospital.  Clear liquids include  --Water or Apple juice without pulp  --Clear carbohydrate beverage such as ClearFast or Gatorade  --Black Coffee or Clear Tea (No milk, no creamers, do not add anything to                  the coffee or Tea Type 1 and type 2 diabetics should only drink water.   ____Ensure clear carbohydrate drink on the way to the hospital for bariatric patients  ____Ensure clear carbohydrate drink 3 hours before surgery.   No gum chewing or hard candies.     __x__ 2. No Alcohol for 24 hours before or after surgery.   __x__3. No Smoking or e-cigarettes for 24 prior to surgery.  Do not use any chewable tobacco products for at least 6 hour prior to surgery   ____  4. Bring all medications with you on the day of surgery if instructed.    __x__ 5. Notify your doctor if there is any change in your medical condition     (cold, fever, infections).    x___6. On the morning of surgery brush your teeth with toothpaste and water.  You may rinse your mouth with mouth wash if you wish.  Do not swallow any toothpaste or mouthwash.   Do not wear jewelry, make-up, hairpins, clips or nail polish.  Do not wear lotions,  powders, or perfumes. You may wear deodorant.  Do not shave 48 hours prior to surgery. Men may shave face and neck.  Do not bring valuables to the hospital.    [facility] is not responsible for any belongings or valuables.               Contacts, dentures or bridgework may not be worn into surgery.  Leave your suitcase in the car. After surgery it may be brought to your room.  For patients admitted to the hospital, discharge time is determined by your                       treatment team.  _  Patients discharged the day of surgery will not be allowed to drive home.  You will need someone to drive you home and stay with you the night of your procedure.    Please read over the following fact sheets that you were given:   [facility] Preparing for [facility] and or MRSA Information   _x___ Take anti-hypertensive listed below, cardiac, seizure, asthma,     anti-reflux and psychiatric medicines. These include:  1. brimonidine (ALPHAGAN) 0.2 % ophthalmic solution  2.dorzolamide (TRUSOPT) 2 % ophthalmic solution  3.latanoprost (XALATAN) 0.005 % ophthalmic solution  4.  5.  6.  ____Fleets enema or Magnesium Citrate as directed.   _x___ Use CHG Soap or sage wipes as directed on instruction sheet   ____ Use inhalers on the day of surgery and bring to hospital day of surgery  _x___ Stop Metformin and Janumet 2 days prior to surgery.    ____ Take 1/2 of usual insulin dose the night before surgery and none on the morning     surgery.   _x___ Follow recommendations from Cardiologist, Pulmonologist or PCP regarding          stopping Aspirin, Coumadin, Plavix ,Eliquis, Effient, or Pradaxa, and Pletal.  X____Stop Anti-inflammatories such as Advil, Aleve, Ibuprofen, Motrin, Naproxen, Naprosyn, Goodies powders or aspirin products. OK to take Tylenol and                          Celebrex.   _x___ Stop supplements until after surgery.  But may continue Vitamin D, Vitamin B,       and  multivitamin.   ____ Bring C-Pap to the hospital.

## 2019-08-16 ENCOUNTER — Encounter
Admission: RE | Admit: 2019-08-16 | Discharge: 2019-08-16 | Disposition: A | Discharge status: Home / Self Care | Payer: Commercial Managed Care - PPO | Source: Ambulatory Visit | Attending: Surgery | Admitting: Surgery

## 2019-08-16 ENCOUNTER — Other Ambulatory Visit: Admission: RE | Admit: 2019-08-16 | Payer: Commercial Managed Care - PPO | Source: Ambulatory Visit

## 2019-08-16 ENCOUNTER — Other Ambulatory Visit: Payer: Self-pay

## 2019-08-16 ENCOUNTER — Other Ambulatory Visit: Payer: Commercial Managed Care - PPO

## 2019-08-16 DIAGNOSIS — E1139 Type 2 diabetes mellitus with other diabetic ophthalmic complication: Secondary | ICD-10-CM | POA: Diagnosis not present

## 2019-08-16 DIAGNOSIS — Z01818 Encounter for other preprocedural examination: Secondary | ICD-10-CM | POA: Diagnosis not present

## 2019-08-16 DIAGNOSIS — E7800 Pure hypercholesterolemia, unspecified: Secondary | ICD-10-CM | POA: Diagnosis not present

## 2019-08-16 DIAGNOSIS — Z87442 Personal history of urinary calculi: Secondary | ICD-10-CM | POA: Diagnosis not present

## 2019-08-16 DIAGNOSIS — I10 Essential (primary) hypertension: Secondary | ICD-10-CM | POA: Diagnosis not present

## 2019-08-16 DIAGNOSIS — Z886 Allergy status to analgesic agent status: Secondary | ICD-10-CM | POA: Diagnosis not present

## 2019-08-16 DIAGNOSIS — Z20828 Contact with and (suspected) exposure to other viral communicable diseases: Secondary | ICD-10-CM | POA: Diagnosis not present

## 2019-08-16 DIAGNOSIS — Z7984 Long term (current) use of oral hypoglycemic drugs: Secondary | ICD-10-CM | POA: Diagnosis not present

## 2019-08-16 DIAGNOSIS — Z7982 Long term (current) use of aspirin: Secondary | ICD-10-CM | POA: Diagnosis not present

## 2019-08-16 DIAGNOSIS — H42 Glaucoma in diseases classified elsewhere: Secondary | ICD-10-CM | POA: Diagnosis not present

## 2019-08-16 DIAGNOSIS — K4090 Unilateral inguinal hernia, without obstruction or gangrene, not specified as recurrent: Secondary | ICD-10-CM | POA: Diagnosis not present

## 2019-08-16 DIAGNOSIS — E119 Type 2 diabetes mellitus without complications: Secondary | ICD-10-CM | POA: Diagnosis not present

## 2019-08-16 DIAGNOSIS — N400 Enlarged prostate without lower urinary tract symptoms: Secondary | ICD-10-CM | POA: Diagnosis not present

## 2019-08-16 DIAGNOSIS — Z79899 Other long term (current) drug therapy: Secondary | ICD-10-CM | POA: Diagnosis not present

## 2019-08-16 DIAGNOSIS — K5900 Constipation, unspecified: Secondary | ICD-10-CM | POA: Diagnosis not present

## 2019-08-16 LAB — CBC
HCT: 45.3 % (ref 39.0–52.0)
Hemoglobin: 14.9 g/dL (ref 13.0–17.0)
MCH: 29.7 pg (ref 26.0–34.0)
MCHC: 32.9 g/dL (ref 30.0–36.0)
MCV: 90.4 fL (ref 80.0–100.0)
Platelets: 249 10*3/uL (ref 150–400)
RBC: 5.01 MIL/uL (ref 4.22–5.81)
RDW: 12 % (ref 11.5–15.5)
WBC: 6.8 10*3/uL (ref 4.0–10.5)
nRBC: 0 % (ref 0.0–0.2)

## 2019-08-16 LAB — BASIC METABOLIC PANEL
Anion gap: 8 (ref 5–15)
BUN: 14 mg/dL (ref 8–23)
CO2: 25 mmol/L (ref 22–32)
Calcium: 9.3 mg/dL (ref 8.9–10.3)
Chloride: 106 mmol/L (ref 98–111)
Creatinine, Ser: 0.83 mg/dL (ref 0.61–1.24)
GFR calc Af Amer: >60 mL/min (ref >60)
GFR calc non Af Amer: >60 mL/min (ref >60)
Glucose, Bld: 144 mg/dL — ABNORMAL HIGH (ref 70–99)
Potassium: 4.2 mmol/L (ref 3.5–5.1)
Sodium: 139 mmol/L (ref 135–145)

## 2019-08-16 LAB — SARS CORONAVIRUS 2 (TAT 6-24 HRS): SARS Coronavirus 2: NEGATIVE

## 2019-08-19 ENCOUNTER — Encounter
Admission: RE | Disposition: A | Discharge status: Home / Self Care | Payer: Self-pay | Source: Home / Self Care | Attending: Surgery

## 2019-08-19 ENCOUNTER — Ambulatory Visit
Admission: RE | Admit: 2019-08-19 | Discharge: 2019-08-19 | Disposition: A | Discharge status: Home / Self Care | Payer: Commercial Managed Care - PPO | Attending: Surgery | Admitting: Surgery

## 2019-08-19 ENCOUNTER — Other Ambulatory Visit: Payer: Self-pay

## 2019-08-19 ENCOUNTER — Ambulatory Visit: Payer: Commercial Managed Care - PPO | Admitting: Certified Registered Nurse Anesthetist

## 2019-08-19 DIAGNOSIS — Z20828 Contact with and (suspected) exposure to other viral communicable diseases: Secondary | ICD-10-CM | POA: Insufficient documentation

## 2019-08-19 DIAGNOSIS — Z886 Allergy status to analgesic agent status: Secondary | ICD-10-CM | POA: Insufficient documentation

## 2019-08-19 DIAGNOSIS — I10 Essential (primary) hypertension: Secondary | ICD-10-CM | POA: Insufficient documentation

## 2019-08-19 DIAGNOSIS — Z7984 Long term (current) use of oral hypoglycemic drugs: Secondary | ICD-10-CM | POA: Insufficient documentation

## 2019-08-19 DIAGNOSIS — K4090 Unilateral inguinal hernia, without obstruction or gangrene, not specified as recurrent: Secondary | ICD-10-CM | POA: Diagnosis not present

## 2019-08-19 DIAGNOSIS — Z87442 Personal history of urinary calculi: Secondary | ICD-10-CM | POA: Insufficient documentation

## 2019-08-19 DIAGNOSIS — Z79899 Other long term (current) drug therapy: Secondary | ICD-10-CM | POA: Insufficient documentation

## 2019-08-19 DIAGNOSIS — E1139 Type 2 diabetes mellitus with other diabetic ophthalmic complication: Secondary | ICD-10-CM | POA: Insufficient documentation

## 2019-08-19 DIAGNOSIS — H42 Glaucoma in diseases classified elsewhere: Secondary | ICD-10-CM | POA: Insufficient documentation

## 2019-08-19 DIAGNOSIS — E7800 Pure hypercholesterolemia, unspecified: Secondary | ICD-10-CM | POA: Insufficient documentation

## 2019-08-19 DIAGNOSIS — N400 Enlarged prostate without lower urinary tract symptoms: Secondary | ICD-10-CM | POA: Insufficient documentation

## 2019-08-19 DIAGNOSIS — K5900 Constipation, unspecified: Secondary | ICD-10-CM | POA: Insufficient documentation

## 2019-08-19 DIAGNOSIS — Z7982 Long term (current) use of aspirin: Secondary | ICD-10-CM | POA: Insufficient documentation

## 2019-08-19 HISTORY — PX: XI ROBOTIC ASSISTED INGUINAL HERNIA REPAIR WITH MESH: SHX6706

## 2019-08-19 HISTORY — DX: Prediabetes: R73.03

## 2019-08-19 LAB — GLUCOSE, CAPILLARY
Glucose-Capillary: 133 mg/dL — ABNORMAL HIGH (ref 70–99)
Glucose-Capillary: 184 mg/dL — ABNORMAL HIGH (ref 70–99)

## 2019-08-19 SURGERY — REPAIR, HERNIA, INGUINAL, ROBOT-ASSISTED, LAPAROSCOPIC, USING MESH
Anesthesia: General | Laterality: Left

## 2019-08-19 MED ORDER — FENTANYL CITRATE (PF) 100 MCG/2ML IJ SOLN
25.0000 ug | INTRAMUSCULAR | Status: DC | PRN
  Administered 2019-08-19 (×2): 25 ug via INTRAVENOUS

## 2019-08-19 MED ORDER — EPHEDRINE SULFATE 50 MG/ML IJ SOLN
INTRAMUSCULAR | Status: DC | PRN
  Administered 2019-08-19 (×2): 10 mg via INTRAVENOUS
  Administered 2019-08-19: 5 mg via INTRAVENOUS

## 2019-08-19 MED ORDER — FENTANYL CITRATE (PF) 100 MCG/2ML IJ SOLN
INTRAMUSCULAR | Status: DC | PRN
  Administered 2019-08-19 (×2): 50 ug via INTRAVENOUS
  Administered 2019-08-19: 100 ug via INTRAVENOUS

## 2019-08-19 MED ORDER — PHENYLEPHRINE HCL (PRESSORS) 10 MG/ML IV SOLN
INTRAVENOUS | Status: DC | PRN
  Administered 2019-08-19: 100 ug via INTRAVENOUS

## 2019-08-19 MED ORDER — ROCURONIUM BROMIDE 100 MG/10ML IV SOLN
INTRAVENOUS | Status: DC | PRN
  Administered 2019-08-19: 10 mg via INTRAVENOUS
  Administered 2019-08-19 (×2): 20 mg via INTRAVENOUS
  Administered 2019-08-19: 50 mg via INTRAVENOUS

## 2019-08-19 MED ORDER — LIDOCAINE HCL 4 % MT SOLN
OROMUCOSAL | Status: DC | PRN
  Administered 2019-08-19: 4 mL via TOPICAL

## 2019-08-19 MED ORDER — ACETAMINOPHEN 325 MG PO TABS: 650.0000 mg | ORAL_TABLET | Freq: Three times a day (TID) | ORAL | 0 refills | Status: AC | PRN

## 2019-08-19 MED ORDER — LIDOCAINE HCL (PF) 2 % IJ SOLN
INTRAMUSCULAR | Status: AC
  Filled 2019-08-19: qty 10

## 2019-08-19 MED ORDER — SODIUM CHLORIDE 0.9 % IV SOLN
INTRAVENOUS | Status: DC
  Administered 2019-08-19: 14:00:00 via INTRAVENOUS

## 2019-08-19 MED ORDER — FAMOTIDINE 20 MG PO TABS
ORAL_TABLET | ORAL | Status: AC
  Administered 2019-08-19: 10:00:00 20 mg via ORAL
  Filled 2019-08-19: qty 1

## 2019-08-19 MED ORDER — BUPIVACAINE LIPOSOME 1.3 % IJ SUSP
INTRAMUSCULAR | Status: AC
  Filled 2019-08-19: qty 20

## 2019-08-19 MED ORDER — SUGAMMADEX SODIUM 200 MG/2ML IV SOLN
INTRAVENOUS | Status: AC
  Filled 2019-08-19: qty 2

## 2019-08-19 MED ORDER — BUPIVACAINE-EPINEPHRINE (PF) 0.5% -1:200000 IJ SOLN
INTRAMUSCULAR | Status: AC
  Filled 2019-08-19: qty 30

## 2019-08-19 MED ORDER — PHENYLEPHRINE HCL (PRESSORS) 10 MG/ML IV SOLN
INTRAVENOUS | Status: AC
  Filled 2019-08-19: qty 1

## 2019-08-19 MED ORDER — LIDOCAINE HCL (CARDIAC) PF 100 MG/5ML IV SOSY
PREFILLED_SYRINGE | INTRAVENOUS | Status: DC | PRN
  Administered 2019-08-19: 80 mg via INTRAVENOUS

## 2019-08-19 MED ORDER — SODIUM CHLORIDE FLUSH 0.9 % IV SOLN
INTRAVENOUS | Status: AC
  Filled 2019-08-19: qty 10

## 2019-08-19 MED ORDER — ONDANSETRON HCL 4 MG/2ML IJ SOLN
INTRAMUSCULAR | Status: AC
  Filled 2019-08-19: qty 2

## 2019-08-19 MED ORDER — FAMOTIDINE 20 MG PO TABS
20.0000 mg | ORAL_TABLET | Freq: Once | ORAL | Status: AC
  Administered 2019-08-19: 10:00:00 20 mg via ORAL

## 2019-08-19 MED ORDER — OXYCODONE HCL 5 MG PO TABS: 5.0000 mg | ORAL_TABLET | Freq: Once | ORAL | Status: DC | PRN

## 2019-08-19 MED ORDER — PROPOFOL 10 MG/ML IV BOLUS
INTRAVENOUS | Status: DC | PRN
  Administered 2019-08-19: 150 mg via INTRAVENOUS

## 2019-08-19 MED ORDER — EPHEDRINE SULFATE 50 MG/ML IJ SOLN
INTRAMUSCULAR | Status: AC
  Filled 2019-08-19: qty 1

## 2019-08-19 MED ORDER — PROPOFOL 10 MG/ML IV BOLUS
INTRAVENOUS | Status: AC
  Filled 2019-08-19: qty 20

## 2019-08-19 MED ORDER — CHLORHEXIDINE GLUCONATE CLOTH 2 % EX PADS: 6.0000 | MEDICATED_PAD | Freq: Once | CUTANEOUS | Status: DC

## 2019-08-19 MED ORDER — SUGAMMADEX SODIUM 200 MG/2ML IV SOLN
INTRAVENOUS | Status: DC | PRN
  Administered 2019-08-19: 200 mg via INTRAVENOUS

## 2019-08-19 MED ORDER — FENTANYL CITRATE (PF) 250 MCG/5ML IJ SOLN
INTRAMUSCULAR | Status: AC
  Filled 2019-08-19: qty 5

## 2019-08-19 MED ORDER — CEFAZOLIN SODIUM-DEXTROSE 2-4 GM/100ML-% IV SOLN
INTRAVENOUS | Status: AC
  Filled 2019-08-19: qty 100

## 2019-08-19 MED ORDER — DEXAMETHASONE SODIUM PHOSPHATE 10 MG/ML IJ SOLN
INTRAMUSCULAR | Status: AC
  Filled 2019-08-19: qty 1

## 2019-08-19 MED ORDER — CEFAZOLIN SODIUM-DEXTROSE 2-4 GM/100ML-% IV SOLN
2.0000 g | INTRAVENOUS | Status: AC
  Administered 2019-08-19: 2 g via INTRAVENOUS

## 2019-08-19 MED ORDER — HYDROCODONE-ACETAMINOPHEN 5-325 MG PO TABS: 1.0000 | ORAL_TABLET | Freq: Four times a day (QID) | ORAL | 0 refills | Status: AC | PRN

## 2019-08-19 MED ORDER — MIDAZOLAM HCL 2 MG/2ML IJ SOLN
INTRAMUSCULAR | Status: AC
  Filled 2019-08-19: qty 2

## 2019-08-19 MED ORDER — ROCURONIUM BROMIDE 50 MG/5ML IV SOLN
INTRAVENOUS | Status: AC
  Filled 2019-08-19: qty 1

## 2019-08-19 MED ORDER — FENTANYL CITRATE (PF) 100 MCG/2ML IJ SOLN
INTRAMUSCULAR | Status: AC
  Administered 2019-08-19: 18:00:00 25 ug via INTRAVENOUS
  Filled 2019-08-19: qty 2

## 2019-08-19 MED ORDER — LACTATED RINGERS IV SOLN
INTRAVENOUS | Status: DC | PRN
  Administered 2019-08-19: 14:00:00 via INTRAVENOUS

## 2019-08-19 MED ORDER — OXYCODONE HCL 5 MG/5ML PO SOLN: 5.0000 mg | Freq: Once | ORAL | Status: DC | PRN

## 2019-08-19 MED ORDER — BUPIVACAINE LIPOSOME 1.3 % IJ SUSP
INTRAMUSCULAR | Status: DC | PRN
  Administered 2019-08-19: 6 mL
  Administered 2019-08-19: 14 mL

## 2019-08-19 MED ORDER — ACETAMINOPHEN 500 MG PO TABS
1000.0000 mg | ORAL_TABLET | ORAL | Status: AC
  Administered 2019-08-19: 10:00:00 1000 mg via ORAL

## 2019-08-19 MED ORDER — DOCUSATE SODIUM 100 MG PO CAPS: 100.0000 mg | ORAL_CAPSULE | Freq: Two times a day (BID) | ORAL | 0 refills | Status: AC | PRN

## 2019-08-19 MED ORDER — BUPIVACAINE-EPINEPHRINE 0.5% -1:200000 IJ SOLN
INTRAMUSCULAR | Status: DC | PRN
  Administered 2019-08-19: 8 mL

## 2019-08-19 MED ORDER — MIDAZOLAM HCL 2 MG/2ML IJ SOLN
INTRAMUSCULAR | Status: DC | PRN
  Administered 2019-08-19: 2 mg via INTRAVENOUS

## 2019-08-19 MED ORDER — ONDANSETRON HCL 4 MG/2ML IJ SOLN
INTRAMUSCULAR | Status: DC | PRN
  Administered 2019-08-19: 4 mg via INTRAVENOUS

## 2019-08-19 MED ORDER — ACETAMINOPHEN 500 MG PO TABS
ORAL_TABLET | ORAL | Status: AC
  Administered 2019-08-19: 1000 mg via ORAL
  Filled 2019-08-19: qty 2

## 2019-08-19 MED ORDER — DEXAMETHASONE SODIUM PHOSPHATE 10 MG/ML IJ SOLN
INTRAMUSCULAR | Status: DC | PRN
  Administered 2019-08-19: 5 mg via INTRAVENOUS

## 2019-08-19 SURGICAL SUPPLY — 62 items
ADH SKN CLS APL DERMABOND .7 (GAUZE/BANDAGES/DRESSINGS) ×1
APL PRP STRL LF DISP 70% ISPRP (MISCELLANEOUS) ×1
BAG INFUSER PRESSURE 100CC (MISCELLANEOUS) ×2 IMPLANT
BLADE SURG SZ11 CARB STEEL (BLADE) ×3 IMPLANT
BNDG GAUZE 4.5X4.1 6PLY STRL (MISCELLANEOUS) ×3 IMPLANT
CANISTER SUCT 1200ML W/VALVE (MISCELLANEOUS) ×3 IMPLANT
CANNULA REDUC XI 12-8 STAPL (CANNULA) ×1
CANNULA REDUC XI 12-8MM STAPL (CANNULA) ×1
CANNULA REDUCER 12-8 DVNC XI (CANNULA) ×1 IMPLANT
CHLORAPREP W/TINT 26 (MISCELLANEOUS) ×3 IMPLANT
COVER MAYO STAND REUSABLE (DRAPES) ×2 IMPLANT
COVER TIP SHEARS 8 DVNC (MISCELLANEOUS) ×1 IMPLANT
COVER TIP SHEARS 8MM DA VINCI (MISCELLANEOUS) ×2
COVER WAND RF STERILE (DRAPES) ×3 IMPLANT
DEFOGGER SCOPE WARMER CLEARIFY (MISCELLANEOUS) ×3 IMPLANT
DERMABOND ADVANCED (GAUZE/BANDAGES/DRESSINGS) ×2
DERMABOND ADVANCED .7 DNX12 (GAUZE/BANDAGES/DRESSINGS) ×1 IMPLANT
DRAPE 3/4 80X56 (DRAPES) ×3 IMPLANT
DRAPE ARM DVNC X/XI (DISPOSABLE) ×4 IMPLANT
DRAPE COLUMN DVNC XI (DISPOSABLE) ×1 IMPLANT
DRAPE DA VINCI XI ARM (DISPOSABLE) ×8
DRAPE DA VINCI XI COLUMN (DISPOSABLE) ×2
ELECT REM PT RETURN 9FT ADLT (ELECTROSURGICAL) ×3
ELECTRODE REM PT RTRN 9FT ADLT (ELECTROSURGICAL) ×1 IMPLANT
ETHIBOND 2 0 GREEN CT 2 30IN (SUTURE) ×4 IMPLANT
FORCEPS PROGRASP DVNC XI (FORCEP) IMPLANT
FORCEPS XI PROGRASP DA VINCI (FORCEP)
GLOVE BIOGEL PI IND STRL 7.0 (GLOVE) ×2 IMPLANT
GLOVE BIOGEL PI INDICATOR 7.0 (GLOVE) ×4
GLOVE SURG SYN 6.5 ES PF (GLOVE) ×6 IMPLANT
GLOVE SURG SYN 6.5 PF PI (GLOVE) ×2 IMPLANT
GOWN STRL REUS W/ TWL LRG LVL3 (GOWN DISPOSABLE) ×3 IMPLANT
GOWN STRL REUS W/TWL LRG LVL3 (GOWN DISPOSABLE) ×9
GRASPER SUT TROCAR 14GX15 (MISCELLANEOUS) ×3 IMPLANT
IRRIGATOR SUCT 8 DISP DVNC XI (IRRIGATION / IRRIGATOR) IMPLANT
IRRIGATOR SUCTION 8MM XI DISP (IRRIGATION / IRRIGATOR)
IV NS 1000ML (IV SOLUTION) ×3
IV NS 1000ML BAXH (IV SOLUTION) IMPLANT
KIT PINK PAD W/HEAD ARE REST (MISCELLANEOUS) ×3
KIT PINK PAD W/HEAD ARM REST (MISCELLANEOUS) ×1 IMPLANT
LABEL OR SOLS (LABEL) ×3 IMPLANT
MESH 3DMAX 4X6 LT LRG (Mesh General) ×3 IMPLANT
NEEDLE HYPO 22GX1.5 SAFETY (NEEDLE) ×3 IMPLANT
NEEDLE VERESS 14GA 120MM (NEEDLE) ×3 IMPLANT
OBTURATOR OPTICAL STANDARD 8MM (TROCAR) ×2
OBTURATOR OPTICAL STND 8 DVNC (TROCAR) ×1
OBTURATOR OPTICALSTD 8 DVNC (TROCAR) ×1 IMPLANT
PACK LAP CHOLECYSTECTOMY (MISCELLANEOUS) ×3 IMPLANT
SEAL CANN UNIV 5-8 DVNC XI (MISCELLANEOUS) ×2 IMPLANT
SEAL XI 5MM-8MM UNIVERSAL (MISCELLANEOUS) ×4
SOLUTION ELECTROLUBE (MISCELLANEOUS) ×3 IMPLANT
STAPLER CANNULA SEAL DVNC XI (STAPLE) ×1 IMPLANT
STAPLER CANNULA SEAL XI (STAPLE) ×2
SUT DVC VLOC 3-0 CL 6 P-12 (SUTURE) ×6 IMPLANT
SUT MNCRL AB 4-0 PS2 18 (SUTURE) ×3 IMPLANT
SUT VIC AB 3-0 SH 27 (SUTURE) ×6
SUT VIC AB 3-0 SH 27X BRD (SUTURE) ×1 IMPLANT
SUT VICRYL 0 AB UR-6 (SUTURE) ×3 IMPLANT
SYR 30ML LL (SYRINGE) ×3 IMPLANT
TRAY FOLEY MTR SLVR 16FR STAT (SET/KITS/TRAYS/PACK) ×3 IMPLANT
TROCAR XCEL NON-BLD 5MMX100MML (ENDOMECHANICALS) ×3 IMPLANT
TUBING EVAC SMOKE HEATED PNEUM (TUBING) ×3 IMPLANT

## 2019-08-19 NOTE — Anesthesia Preprocedure Evaluation (Addendum)
Anesthesia Evaluation  Patient identified by MRN, date of birth, ID band Patient awake    Reviewed: Allergy & Precautions, NPO status , Patient's Chart, lab work & pertinent test results  History of Anesthesia Complications Negative for: history of anesthetic complications  Airway Mallampati: II       Dental   Pulmonary neg sleep apnea, neg COPD, Not current smoker,           Cardiovascular (-) hypertension(-) Past MI and (-) CHF (-) dysrhythmias (-) Valvular Problems/Murmurs     Neuro/Psych neg Seizures    GI/Hepatic Neg liver ROS, neg GERD  ,  Endo/Other  diabetes, Type 2, Oral Hypoglycemic Agents  Renal/GU negative Renal ROS     Musculoskeletal   Abdominal   Peds  Hematology   Anesthesia Other Findings   Reproductive/Obstetrics                            Anesthesia Physical Anesthesia Plan  ASA: II  Anesthesia Plan: General   Post-op Pain Management:    Induction: Intravenous  PONV Risk Score and Plan: 2 and Ondansetron and Dexamethasone  Airway Management Planned: Oral ETT  Additional Equipment:   Intra-op Plan:   Post-operative Plan:   Informed Consent: I have reviewed the patients History and Physical, chart, labs and discussed the procedure including the risks, benefits and alternatives for the proposed anesthesia with the patient or authorized representative who has indicated his/her understanding and acceptance.       Plan Discussed with:   Anesthesia Plan Comments:         Anesthesia Quick Evaluation

## 2019-08-19 NOTE — Anesthesia Postprocedure Evaluation (Signed)
Anesthesia Post Note  Patient: Brandon Moon  Procedure(s) Performed: XI ROBOTIC ASSISTED LAPARASCOPIC LEFT INGUINAL HERNIA REPAIR (Left )  Patient location during evaluation: PACU Anesthesia Type: General Level of consciousness: awake and alert Pain management: pain level controlled Vital Signs Assessment: post-procedure vital signs reviewed and stable Respiratory status: spontaneous breathing, nonlabored ventilation, respiratory function stable and patient connected to nasal cannula oxygen Cardiovascular status: blood pressure returned to baseline and stable Postop Assessment: no apparent nausea or vomiting Anesthetic complications: no     Last Vitals:  Vitals:   08/19/19 1847 08/19/19 1857  BP: 130/85 (!) 133/91  Pulse: 79 75  Resp: 13 18  Temp: 36.5 C (!) 36.3 C  SpO2: 95%     Last Pain:  Vitals:   08/19/19 1857  TempSrc:   PainSc: 2                  Precious Haws Loretto Belinsky

## 2019-08-19 NOTE — Anesthesia Procedure Notes (Signed)
Procedure Name: Intubation Date/Time: 08/19/2019 1:51 PM Performed by: Eben Burow, CRNA Pre-anesthesia Checklist: Patient identified, Emergency Drugs available, Suction available and Patient being monitored Patient Re-evaluated:Patient Re-evaluated prior to induction Oxygen Delivery Method: Circle system utilized Preoxygenation: Pre-oxygenation with 100% oxygen Induction Type: IV induction Ventilation: Mask ventilation without difficulty Laryngoscope Size: Miller and 3 Grade View: Grade I Tube type: Oral Tube size: 8.0 mm Number of attempts: 1 Airway Equipment and Method: Stylet,  Oral airway and LTA kit utilized Placement Confirmation: ETT inserted through vocal cords under direct vision,  positive ETCO2 and breath sounds checked- equal and bilateral Secured at: 24 cm Tube secured with: Tape Dental Injury: Teeth and Oropharynx as per pre-operative assessment

## 2019-08-19 NOTE — Transfer of Care (Signed)
Immediate Anesthesia Transfer of Care Note  Patient: Brandon Moon  Procedure(s) Performed: XI ROBOTIC ASSISTED LAPARASCOPIC LEFT INGUINAL HERNIA REPAIR (Left )  Patient Location: PACU  Anesthesia Type:General  Level of Consciousness: awake, alert  and drowsy  Airway & Oxygen Therapy: Patient Spontanous Breathing and Patient connected to face mask oxygen  Post-op Assessment: Report given to RN and Post -op Vital signs reviewed and stable  Post vital signs: Reviewed and stable  Last Vitals:  Vitals Value Taken Time  BP 137/93 08/19/19 1748  Temp 36.7 C 08/19/19 1748  Pulse 85 08/19/19 1755  Resp 18 08/19/19 1755  SpO2 96 % 08/19/19 1755  Vitals shown include unvalidated device data.  Last Pain:  Vitals:   08/19/19 1007  TempSrc: Tympanic  PainSc: 0-No pain         Complications: No apparent anesthesia complications

## 2019-08-19 NOTE — Anesthesia Post-op Follow-up Note (Signed)
Anesthesia QCDR form completed.        

## 2019-08-19 NOTE — Discharge Instructions (Addendum)
AMBULATORY SURGERY  DISCHARGE INSTRUCTIONS   1) The drugs that you were given will stay in your system until tomorrow so for the next 24 hours you should not:  A) Drive an automobile B) Make any legal decisions C) Drink any alcoholic beverage   2) You may resume regular meals tomorrow.  Today it is better to start with liquids and gradually work up to solid foods.  You may eat anything you prefer, but it is better to start with liquids, then soup and crackers, and gradually work up to solid foods.   3) Please notify your doctor immediately if you have any unusual bleeding, trouble breathing, redness and pain at the surgery site, drainage, fever, or pain not relieved by medication.    4) Additional Instructions:        Please contact your physician with any problems or Same Day Surgery at [phone], Monday through Friday 6 am to 4 pm, or [place] at [facility] Main number at [phone]   .Hernia repair, Care After This sheet gives you information about how to care for yourself after your procedure. Your health care provider may also give you more specific instructions. If you have problems or questions, contact your health care provider. What can I expect after the procedure? After your procedure, it is common to have the following:  Pain in your abdomen, especially in the incision areas. You will be given medicine to control the pain.  Tiredness. This is a normal part of the recovery process. Your energy level will return to normal over the next several weeks.  Changes in your bowel movements, such as constipation or needing to go more often. Talk with your health care provider about how to manage this. Follow these instructions at home: Medicines   tylenol as needed for discomfort.     Use narcotics, if prescribed, only when tylenol  is not enough to control pain.   325-650mg  every 8hrs to max of 3000mg /24hrs (including the 325mg  in every norco dose) for the  tylenol.    PLEASE RECORD NUMBER OF PILLS TAKEN UNTIL NEXT FOLLOW UP APPT.  THIS WILL HELP DETERMINE HOW READY YOU ARE TO BE RELEASED FROM ANY ACTIVITY RESTRICTIONS  Do not drive or use heavy machinery while taking prescription pain medicine.  Do not drink alcohol while taking prescription pain medicine.  Incision care     Follow instructions from your health care provider about how to take care of your incision areas. Make sure you: ? Keep your incisions clean and dry. ? Wash your hands with soap and water before and after applying medicine to the areas, and before and after changing your bandage (dressing). If soap and water are not available, use hand sanitizer. ? Change your dressing as told by your health care provider. ? Leave stitches (sutures), skin glue, or adhesive strips in place. These skin closures may need to stay in place for 2 weeks or longer. If adhesive strip edges start to loosen and curl up, you may trim the loose edges. Do not remove adhesive strips completely unless your health care provider tells you to do that.  Do not wear tight clothing over the incisions. Tight clothing may rub and irritate the incision areas, which may cause the incisions to open.  Do not take baths, swim, or use a hot tub until your health care provider approves. OK TO SHOWER IN 24HRS.    Check your incision area every day for signs of infection. Check for: ? More  redness, swelling, or pain. ? More fluid or blood. ? Warmth. ? Pus or a bad smell. Activity  Avoid lifting anything that is heavier than 10 lb (4.5 kg) for 2 weeks or until your health care provider says it is okay.  No pushing/pulling greater than 30lbs  You may resume normal activities as told by your health care provider. Ask your health care provider what activities are safe for you.  Take rest breaks during the day as needed. Eating and drinking  Follow instructions from your health care provider about what you can eat  after surgery.  To prevent or treat constipation while you are taking prescription pain medicine, your health care provider may recommend that you: ? Drink enough fluid to keep your urine clear or pale yellow. ? Take over-the-counter or prescription medicines. ? Eat foods that are high in fiber, such as fresh fruits and vegetables, whole grains, and beans. ? Limit foods that are high in fat and processed sugars, such as fried and sweet foods. General instructions  Ask your health care provider when you will need an appointment to get your sutures or staples removed.  Keep all follow-up visits as told by your health care provider. This is important. Contact a health care provider if:  You have more redness, swelling, or pain around your incisions.  You have more fluid or blood coming from the incisions.  Your incisions feel warm to the touch.  You have pus or a bad smell coming from your incisions or your dressing.  You have a fever.  You have an incision that breaks open (edges not staying together) after sutures or staples have been removed. Get help right away if:  You develop a rash.  You have chest pain or difficulty breathing.  You have pain or swelling in your legs.  You feel light-headed or you faint.  Your abdomen swells (becomes distended).  You have nausea or vomiting.  You have blood in your stool (feces). This information is not intended to replace advice given to you by your health care provider. Make sure you discuss any questions you have with your health care provider. Document Released: 06/20/2005 Document Revised: 08/20/2018 Document Reviewed: 09/01/2016 Elsevier Interactive Patient Education  2019 [employer].

## 2019-08-19 NOTE — Interval H&P Note (Signed)
History and Physical Interval Note:  08/19/2019 10:49 AM  Brandon Moon  has presented today for surgery, with the diagnosis of K40.90 LEFT INQUINAL HERNIA REPAIR.  The various methods of treatment have been discussed with the patient and family. After consideration of risks, benefits and other options for treatment, the patient has consented to  Procedure(s): XI ROBOTIC ASSISTED LAPARASCOPIC LEFT INGUINAL HERNIA REPAIR (Left) as a surgical intervention.  The patient's history has been reviewed, patient examined, no change in status, stable for surgery.  I have reviewed the patient's chart and labs.  Questions were answered to the patient's satisfaction.     Shomari Scicchitano Lysle Pearl

## 2019-08-21 ENCOUNTER — Encounter: Payer: Self-pay | Admitting: Surgery

## 2019-08-21 NOTE — Op Note (Signed)
Preoperative diagnosis: Left inguinal Hernia.  Postoperative diagnosis: Left direct inguinal Hernia  Procedure: Robotic assisted laparoscopic Left direct inguinal hernia repair with mesh  Anesthesia: General  Surgeon: Dr. Lysle Pearl  Wound Classification: Clean  Specimen: none  Complications: None  Estimated Blood Loss: 50mL   Indications:  inguinal hernia. Repair was indicated to avoid complications of incarceration, obstruction and pain, and a prosthetic mesh repair was elected.  See H&P for further details.  Findings: 1. Vas Deferens and cord structures identified and preserved 2. Bard 3D max mesh used for repair 3. Adequate hemostasis achieved   Description of procedure: The patient was taken to the operating room. A time-out was completed verifying correct patient, procedure, site, positioning, and implant(s) and/or special equipment prior to beginning this procedure.  Left groin was prepped and draped in the usual sterile fashion. An incision was marked 20 cm above the pubic tubercle, slightly above the umbilicus  Scrotum wrapped in Kerlix roll.  Foley catheter placed.  Incision was made at the previously marked site after injecting local anesthesia.  Dissection carried down to the fascia where at this point a Veress needle was inserted.  Saline drop test noted to be positive with gradual increase in pressure after initiation of gas insufflation.  15 mm of pressure was achieved prior to removing the Veress needle and then placing a 5 mm port via the Optiview technique through the previous needle insertion site.  Inspection of the area afterwards noted no injury to the surrounding organs during insertion of the needle and the Optiview port, although multiple adhesions were noted throughout upper area of abdomen.     2 port sites were marked 8 cm to the lateral sides of the initial port, and a 8 mm robotic port was placed on the left side, 12 mm robotic port on the right side under  direct supervision.  Local anesthesia  infused to the preplanned incision site prior to insertion of the port, and exparel infused as an ilioinguinal block under direct visualization. The Buxton was then brought into the operative field and docked to the ports.  Examination of the abdominal cavity noted a large Left indirect inguinal hernia with extensive scarring.  Majority of the case had to be dedicated to taking down extensive scar tissue in order to reduce this large hernia.  A peritoneal flap was created approximately 8cm cephalad to the defect by using scissors with electrocautery.  Dissection was carried down towards the pubic tubercle, developing the myopectineal orifice view.  Laterally the flap was carried towards the ASIS.  Large indirect hernia sac was noted, which carefully dissected away from the adjacent tissues to be fully reduced out of hernia cavity.  Any bleeding was controlled with combination of electrocautery and manual pressure.    After confirming adequate dissection and the peritoneal reflection completely down and away from the cord structures, a Bard 3DMax large mesh was placed within the anterior abdominal wall, secured in place using 2-0 Vicryl on an SH needle immediately above the pubic tubercle.  After noting proper placement of the mesh with the peritoneal reflection deep to it, the previously created peritoneal flap was secured back up to the anterior abdominal wall using running 3-0 V-Lock on a noncutting needle.  Both needles were then removed out of the abdominal cavity, Xi platform undocked from the ports and removed off of operative field.  The 12 mm port site closed with PMI device using 0 Vicryl, using the robotic camera.  Abdomen then desufflated and remaining ports removed.  The deep dermal layer at 12 mm port site closed with interrupted 3-0 Vicryl.  All the skin incisions were then closed with a subcuticular stitch of Monocryl 4-0. Dermabond was  applied. The testis was gently pulled down into its anatomic position in the scrotum.  Foley catheter removed. The patient tolerated the procedure well and was taken to the postanesthesia care unit in stable condition. Sponge and instrument count correct at end of procedure.

## 2020-08-14 ENCOUNTER — Other Ambulatory Visit: Payer: Self-pay | Admitting: Surgery

## 2020-08-14 ENCOUNTER — Other Ambulatory Visit (HOSPITAL_COMMUNITY): Payer: Self-pay | Admitting: Surgery

## 2020-08-14 DIAGNOSIS — R1032 Left lower quadrant pain: Secondary | ICD-10-CM

## 2020-08-16 ENCOUNTER — Other Ambulatory Visit: Payer: Self-pay

## 2020-08-16 ENCOUNTER — Other Ambulatory Visit
Admission: RE | Admit: 2020-08-16 | Discharge: 2020-08-16 | Disposition: A | Discharge status: Home / Self Care | Payer: Commercial Managed Care - PPO | Source: Home / Self Care | Attending: Surgery | Admitting: Surgery

## 2020-08-16 ENCOUNTER — Ambulatory Visit
Admission: RE | Admit: 2020-08-16 | Discharge: 2020-08-16 | Disposition: A | Discharge status: Home / Self Care | Payer: Commercial Managed Care - PPO | Source: Ambulatory Visit | Attending: Surgery | Admitting: Surgery

## 2020-08-16 DIAGNOSIS — R1032 Left lower quadrant pain: Secondary | ICD-10-CM | POA: Insufficient documentation

## 2020-08-16 LAB — COMPREHENSIVE METABOLIC PANEL
ALT: 31 U/L (ref 0–44)
AST: 24 U/L (ref 15–41)
Albumin: 4.5 g/dL (ref 3.5–5.0)
Alkaline Phosphatase: 57 U/L (ref 38–126)
Anion gap: 8 (ref 5–15)
BUN: 13 mg/dL (ref 8–23)
CO2: 25 mmol/L (ref 22–32)
Calcium: 8.8 mg/dL — ABNORMAL LOW (ref 8.9–10.3)
Chloride: 102 mmol/L (ref 98–111)
Creatinine, Ser: 0.88 mg/dL (ref 0.61–1.24)
GFR calc Af Amer: >60 mL/min (ref >60)
GFR calc non Af Amer: >60 mL/min (ref >60)
Glucose, Bld: 145 mg/dL — ABNORMAL HIGH (ref 70–99)
Potassium: 4.5 mmol/L (ref 3.5–5.1)
Sodium: 135 mmol/L (ref 135–145)
Total Bilirubin: 1 mg/dL (ref 0.3–1.2)
Total Protein: 7.3 g/dL (ref 6.5–8.1)

## 2020-08-16 MED ORDER — IOHEXOL 300 MG/ML  SOLN
100.0000 mL | Freq: Once | INTRAMUSCULAR | Status: AC | PRN
  Administered 2020-08-16: 100 mL via INTRAVENOUS

## 2021-06-07 ENCOUNTER — Ambulatory Visit
Admission: EM | Admit: 2021-06-07 | Discharge: 2021-06-07 | Disposition: A | Discharge status: Home / Self Care | Payer: Commercial Managed Care - PPO | Attending: Sports Medicine | Admitting: Sports Medicine

## 2021-06-07 ENCOUNTER — Encounter: Payer: Self-pay | Admitting: Emergency Medicine

## 2021-06-07 ENCOUNTER — Other Ambulatory Visit: Payer: Self-pay

## 2021-06-07 ENCOUNTER — Ambulatory Visit (INDEPENDENT_AMBULATORY_CARE_PROVIDER_SITE_OTHER): Payer: Commercial Managed Care - PPO

## 2021-06-07 DIAGNOSIS — M25552 Pain in left hip: Secondary | ICD-10-CM | POA: Diagnosis not present

## 2021-06-07 DIAGNOSIS — Z8719 Personal history of other diseases of the digestive system: Secondary | ICD-10-CM

## 2021-06-07 DIAGNOSIS — R1032 Left lower quadrant pain: Secondary | ICD-10-CM

## 2021-06-07 DIAGNOSIS — M25652 Stiffness of left hip, not elsewhere classified: Secondary | ICD-10-CM

## 2021-06-07 DIAGNOSIS — Z9889 Other specified postprocedural states: Secondary | ICD-10-CM

## 2021-06-07 MED ORDER — ETODOLAC 500 MG PO TABS: 500.0000 mg | ORAL_TABLET | Freq: Two times a day (BID) | ORAL | 0 refills | Status: DC

## 2021-06-07 NOTE — ED Provider Notes (Signed)
[facility]    CSN: [id number] Arrival date & time: 06/07/21  0843      History   Chief Complaint Chief Complaint  Patient presents with   Groin Pain    Left side    HPI Brandon Moon is a 65 y.o. male.   Patient is a pleasant 65 year old male who presents for evaluation of left-sided groin and hip pain.  He has had a symptoms now for 3 days.  No accidents trauma or falls.  Normally sees Dr. Madelin Rear at [facility] for his ongoing medical care.  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  He denies any accidents trauma falls or twists.  It is not Worker's Comp.  Complicating his situation is that he has had a left inguinal hernia repair with mesh done in 2020.  He has not called his Careers adviser.  He denies any urinary symptoms.  He has a little bit of loose stools but no nausea or vomiting.  No fever shakes chills.  No pain in the penis or testicular pain.  No history of a varicocele, hydrocele, or sperm seal.  He did have an appendicitis as a kid and required surgery.  He is also had some renal stones requiring lithotripsy.  He denies any significant back pain numbness or tingling.  He does say that he has some issues with lifting his leg especially with hip flexion.  He is also having difficulty straightening his left leg.  This causes pain into his left groin.  He denies any numbness tingling or any foot drop.  No saddle anesthesia.  No radicular symptoms.   Past Medical History:  Diagnosis Date   BPH (benign prostatic hyperplasia)    Glaucoma    Hernia of abdominal wall    High cholesterol    History of kidney stones 2013   eswl at that time   Pre-diabetes     There are no problems to display for this patient.   Past Surgical History:  Procedure Laterality Date   APPENDECTOMY     COLONOSCOPY     EXTRACORPOREAL SHOCK WAVE LITHOTRIPSY  2013   EXTRACORPOREAL SHOCK WAVE LITHOTRIPSY Left 08/06/2017   Procedure:  EXTRACORPOREAL SHOCK WAVE LITHOTRIPSY (ESWL);  Surgeon: Orson Ape, MD;  Location: [facility];  Service: Urology;  Laterality: Left;   EYE SURGERY     HERNIA REPAIR     XI ROBOTIC ASSISTED INGUINAL HERNIA REPAIR WITH MESH Left 08/19/2019   Procedure: XI ROBOTIC ASSISTED LAPARASCOPIC LEFT INGUINAL HERNIA REPAIR;  Surgeon: Sung Amabile, DO;  Location: [facility] OR[facility];  Service: General;  Laterality: Left;       Home Medications    Prior to Admission medications   Medication Sig Start Date End Date Taking? Authorizing Provider  atorvastatin (LIPITOR) 10 MG tablet Take 10 mg by mouth daily at 6 PM.  12/08/18  Yes [provider]  brimonidine (ALPHAGAN) 0.2 % ophthalmic solution Place 1 drop into both eyes 2 (two) times daily.   Yes [provider]  dorzolamide (TRUSOPT) 2 % ophthalmic solution Place 1 drop into both eyes 2 (two) times daily.   Yes [provider]  etodolac (LODINE) 500 MG tablet Take 1 tablet (500 mg total) by mouth 2 (two) times daily. 06/07/21  Yes Delton See, MD  finasteride (PROSCAR) 5 MG tablet Take 5 mg by mouth daily.    Yes [provider]  latanoprost (XALATAN) 0.005 % ophthalmic  solution Place 1 drop into both eyes at bedtime.   Yes [provider]  metFORMIN (GLUCOPHAGE) 500 MG tablet Take 500 mg by mouth daily with breakfast.    Yes [provider]  aspirin EC 81 MG tablet Take 81 mg by mouth daily.    [provider]    Family History Family History  Problem Relation Age of Onset   Cancer Mother    Cancer Father     Social History Social History   Tobacco Use   Smoking status: Never   Smokeless tobacco: Never  Vaping Use   Vaping Use: Never used  Substance Use Topics   Alcohol use: No   Drug use: Never     Allergies   Aspirin   Review of Systems Review of Systems  Constitutional:  Positive for activity change. Negative for appetite change, chills, diaphoresis, fatigue and  fever.  HENT:  Negative for congestion, ear pain, postnasal drip, rhinorrhea, sinus pressure, sinus pain, sneezing and sore throat.   Eyes:  Negative for pain.  Respiratory:  Negative for cough, chest tightness and shortness of breath.   Cardiovascular:  Negative for chest pain and palpitations.  Gastrointestinal:  Negative for abdominal pain, diarrhea, nausea and vomiting.  Genitourinary:  Negative for dysuria, flank pain, frequency, hematuria, penile discharge, penile pain, penile swelling, scrotal swelling, testicular pain and urgency.  Musculoskeletal:  Positive for arthralgias and gait problem. Negative for back pain, joint swelling, myalgias, neck pain and neck stiffness.  Skin:  Negative for color change, pallor, rash and wound.  Neurological:  Negative for dizziness, light-headedness, numbness and headaches.  All other systems reviewed and are negative.   Physical Exam Triage Vital Signs ED Triage Vitals  Enc Vitals Group     BP 06/07/21 0900 (!) 135/98     Pulse Rate 06/07/21 0900 72     Resp 06/07/21 0900 16     Temp 06/07/21 0900 98.1 F (36.7 C)     Temp Source 06/07/21 0900 Oral     SpO2 06/07/21 0900 97 %     Weight 06/07/21 0857 184 lb (83.5 kg)     Height 06/07/21 0857 5\' 9"  (1.753 m)     Head Circumference --      Peak Flow --      Pain Score 06/07/21 0857 8     Pain Loc --      Pain Edu? --      Excl. in [place]? --    No data found.  Updated Vital Signs BP (!) 135/98 (BP Location: Left Arm)   Pulse 72   Temp 98.1 F (36.7 C) (Oral)   Resp 16   Ht 5\' 9"  (1.753 m)   Wt 83.5 kg   SpO2 97%   BMI 27.17 kg/m   Visual Acuity Right Eye Distance:   Left Eye Distance:   Bilateral Distance:    Right Eye Near:   Left Eye Near:    Bilateral Near:     Physical Exam Vitals and nursing note reviewed.  Constitutional:      General: He is not in acute distress.    Appearance: Normal appearance. He is not ill-appearing, toxic-appearing or diaphoretic.  HENT:      Head: Normocephalic and atraumatic.     Nose: Nose normal.     Mouth/Throat:     Mouth: Mucous membranes are moist.  Eyes:     Conjunctiva/sclera: Conjunctivae normal.     Pupils: Pupils are equal, round, and  reactive to light.  Cardiovascular:     Rate and Rhythm: Normal rate and regular rhythm.     Pulses: Normal pulses.     Heart sounds: Normal heart sounds. No murmur heard.   No friction rub. No gallop.  Pulmonary:     Effort: Pulmonary effort is normal.     Breath sounds: Normal breath sounds. No stridor. No wheezing, rhonchi or rales.  Abdominal:     General: Abdomen is flat. There is no distension.     Tenderness: There is no abdominal tenderness. There is no right CVA tenderness, left CVA tenderness, guarding or rebound.     Hernia: No hernia is present.     Comments: There is some minimal tenderness in the left groin area and deep within the left hip.  No rebound or guarding.  No actual discomfort, pain with deep palpation into the left lower abdomen.  Musculoskeletal:     Cervical back: Normal range of motion and neck supple.     Comments: Examination of the right hip reveals good internal and external rotation to about 25 to 30 degrees.  He has no groin pain.  Strength is well-preserved.  Examination of the left hip causes him significant discomfort with flexion past 90 degrees.  He is unable to really lift his leg from a supine position and full extension without pain that radiates into the groin.  He has limitations with both internal and external rotation about 5 degrees with discomfort.  Deferred on scour's test.  No tenderness over the greater trochanteric bursa.  Negative straight leg raise.  Skin:    General: Skin is warm and dry.     Capillary Refill: Capillary refill takes less than 2 seconds.  Neurological:     General: No focal deficit present.     Mental Status: He is alert and oriented to person, place, and time.     UC Treatments / Results  Labs (all  labs ordered are listed, but only abnormal results are displayed) Labs Reviewed - No data to display  EKG   Radiology DG Hip Unilat With Pelvis 2-3 Views Left  Result Date: 06/07/2021 CLINICAL DATA:  Left-sided groin and hip pain EXAM: DG HIP (WITH OR WITHOUT PELVIS) 2-3V LEFT COMPARISON:  None. FINDINGS: No acute fracture or dislocation. No aggressive osseous lesion. Normal alignment. Left hip joint space is maintained. Lower lumbar spine spondylosis. Soft tissue are unremarkable. No radiopaque foreign body or soft tissue emphysema. IMPRESSION: No acute osseous injury of the left hip. Electronically Signed   By: Elige Ko   On: 06/07/2021 10:41    Procedures Procedures (including critical care time)  Medications Ordered in UC Medications - No data to display  Initial Impression / Assessment and Plan / UC Course  I have reviewed the triage vital signs and the nursing notes.  Pertinent labs & imaging results that were available during my care of the patient were reviewed by me and considered in my medical decision making (see chart for details).  Clinical impression: 1.  3 days of left groin pain with concern for intra-articular hip pathology including hip osteoarthritis. 2.  Prior history of left hernia repair with mesh.  Exam does not seem consistent with a current hernia.  Treatment plan: 1.  The findings and treatment plan were discussed in detail with the patient.  Patient was in agreement. 2.  Recommended getting an x-ray of the left hip.  It was ordered and interpreted by myself here in the  office.  Results are above.  No acute osseous findings.  No significant degenerative changes. 3.  We will refer to orthopedics given that I think this is an intra-articular hip issue and the patient may need advanced imaging or potentially a injection through interventional radiology or physical medicine and rehabilitation. 4.  I also want him to reach out to his surgeon who did the hernia  repair and see if he can be seen as soon as possible to make sure that this is not a recurrence or an issue with his hernia repair. 5.  Educational handouts provided. 6.  I prescribed an anti-inflammatory for 2 weeks to see if that helps.  No Motrin, Advil, ibuprofen, Naprosyn, or Aleve.  He does take a baby aspirin and that is fine. 7.  Plenty of rest, plenty of fluids.  I did give him a work note as well to keep him out of work for the weekend and he can return Monday assuming that he is doing fine. 8.  If symptoms persist then he can get into his surgeon or orthopedics in a timely fashion, he should go with his primary care provider. 9.  If symptoms worsen then he should go to the ER for higher level of care and advanced imaging. 10.  He was discharged in stable condition and will follow-up here as needed.    Final Clinical Impressions(s) / UC Diagnoses   Final diagnoses:  Acute pain of left hip  Left groin pain  Hip stiffness, left  History of left inguinal hernia repair     Discharge Instructions      As we discussed, your x-rays did not show significant degenerative changes.  I have asked you to go ahead and contact orthopedics to see if you need advanced imaging or referral for potentially an intra-articular hip injection. Also please reach out to your surgeon who did your hernia repair to see if they can evaluate you and make sure that this is not a complication from that surgery. Please see educational handouts. I prescribed an anti-inflammatory that you get from your pharmacy to see if this helps her symptoms.  No Motrin, Advil, ibuprofen, Naprosyn, or Aleve.  You can only take a baby aspirin.  You can also take Tylenol if needed. I provided you a work note keeping her out of work until Monday.  Hopefully your well enough to go back by then. If you cannot get into orthopedics or surgery in a timely fashion and your symptoms persist then please see your primary care provider. If  your symptoms worsen in any way then please go to the ER as you may need a higher level of care and advanced imaging.      ED Prescriptions     Medication Sig Dispense Auth. Provider   etodolac (LODINE) 500 MG tablet Take 1 tablet (500 mg total) by mouth 2 (two) times daily. 30 tablet Delton See, MD      PDMP not reviewed this encounter.   Delton See, MD 06/08/21 [phone]

## 2021-06-07 NOTE — ED Triage Notes (Signed)
Patient c/o left side groin pain that started Wed night.  Patient reports history of hernias.  Patient denies any fall or injury.

## 2021-06-07 NOTE — Discharge Instructions (Addendum)
As we discussed, your x-rays did not show significant degenerative changes.  I have asked you to go ahead and contact orthopedics to see if you need advanced imaging or referral for potentially an intra-articular hip injection. Also please reach out to your surgeon who did your hernia repair to see if they can evaluate you and make sure that this is not a complication from that surgery. Please see educational handouts. I prescribed an anti-inflammatory that you get from your pharmacy to see if this helps her symptoms.  No Motrin, Advil, ibuprofen, Naprosyn, or Aleve.  You can only take a baby aspirin.  You can also take Tylenol if needed. I provided you a work note keeping him out of work until Monday.  Hopefully your well enough to go back by then. If you cannot get into orthopedics or surgery in a timely fashion and your symptoms persist then please see your primary care provider. If your symptoms worsen in any way then please go to the ER as you may need a higher level of care and advanced imaging.

## 2022-03-10 IMAGING — CR DG HIP (WITH OR WITHOUT PELVIS) 2-3V*L*
3 series · 4 of 4 positions shown · non-contrast
Comparison: None.

CLINICAL DATA: Left-sided groin and hip pain

EXAM:
DG HIP (WITH OR WITHOUT PELVIS) 2-3V LEFT

[Series 1: pelvis ap · 0.14mm/px · 2 of 2 slices shown]
[im 1/2]
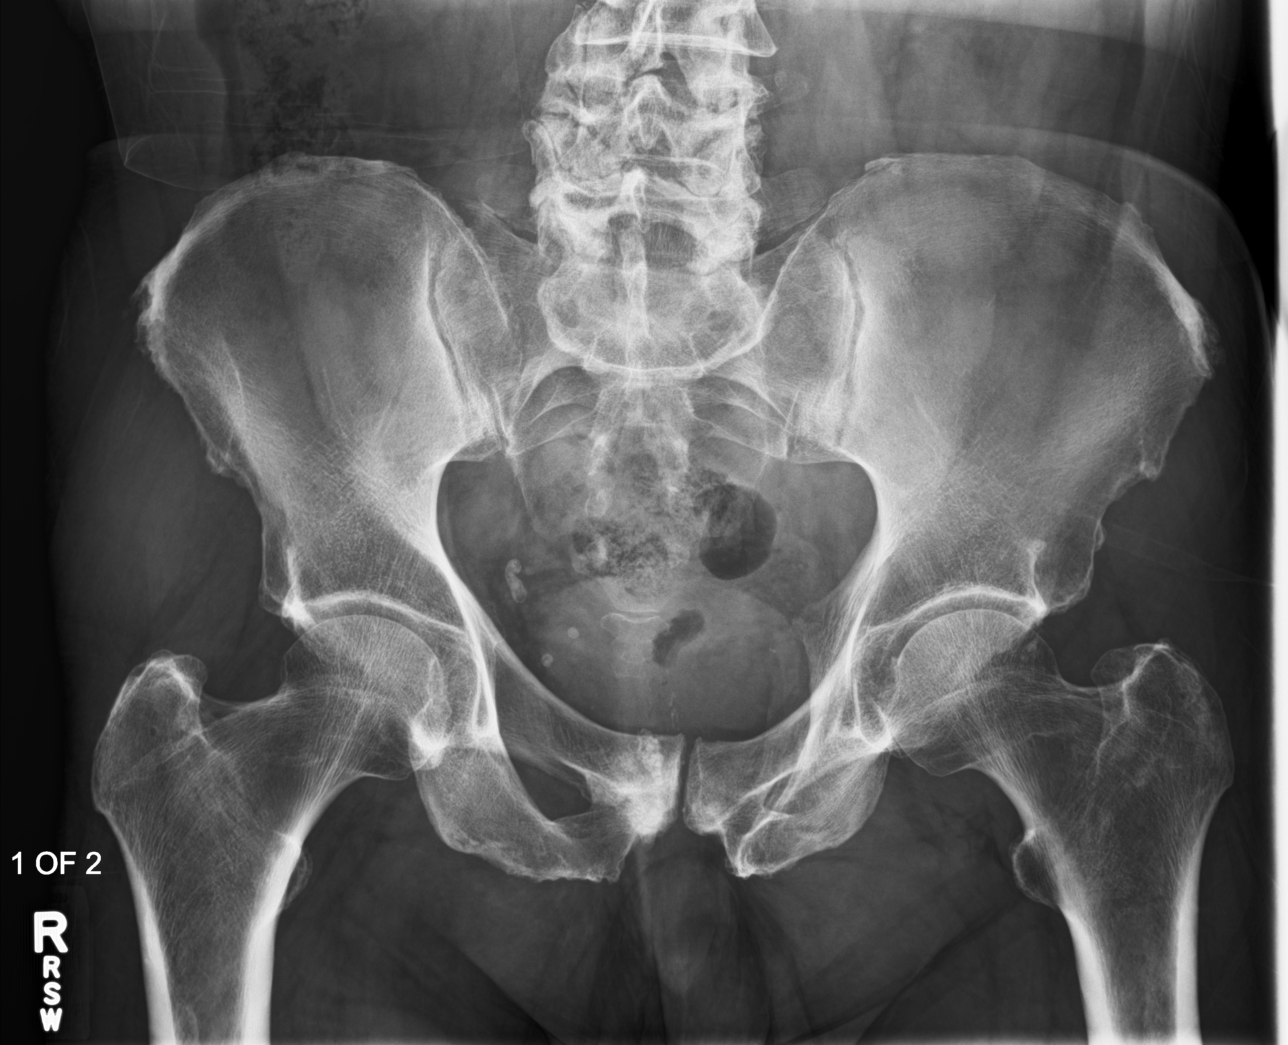
[im 2/2]
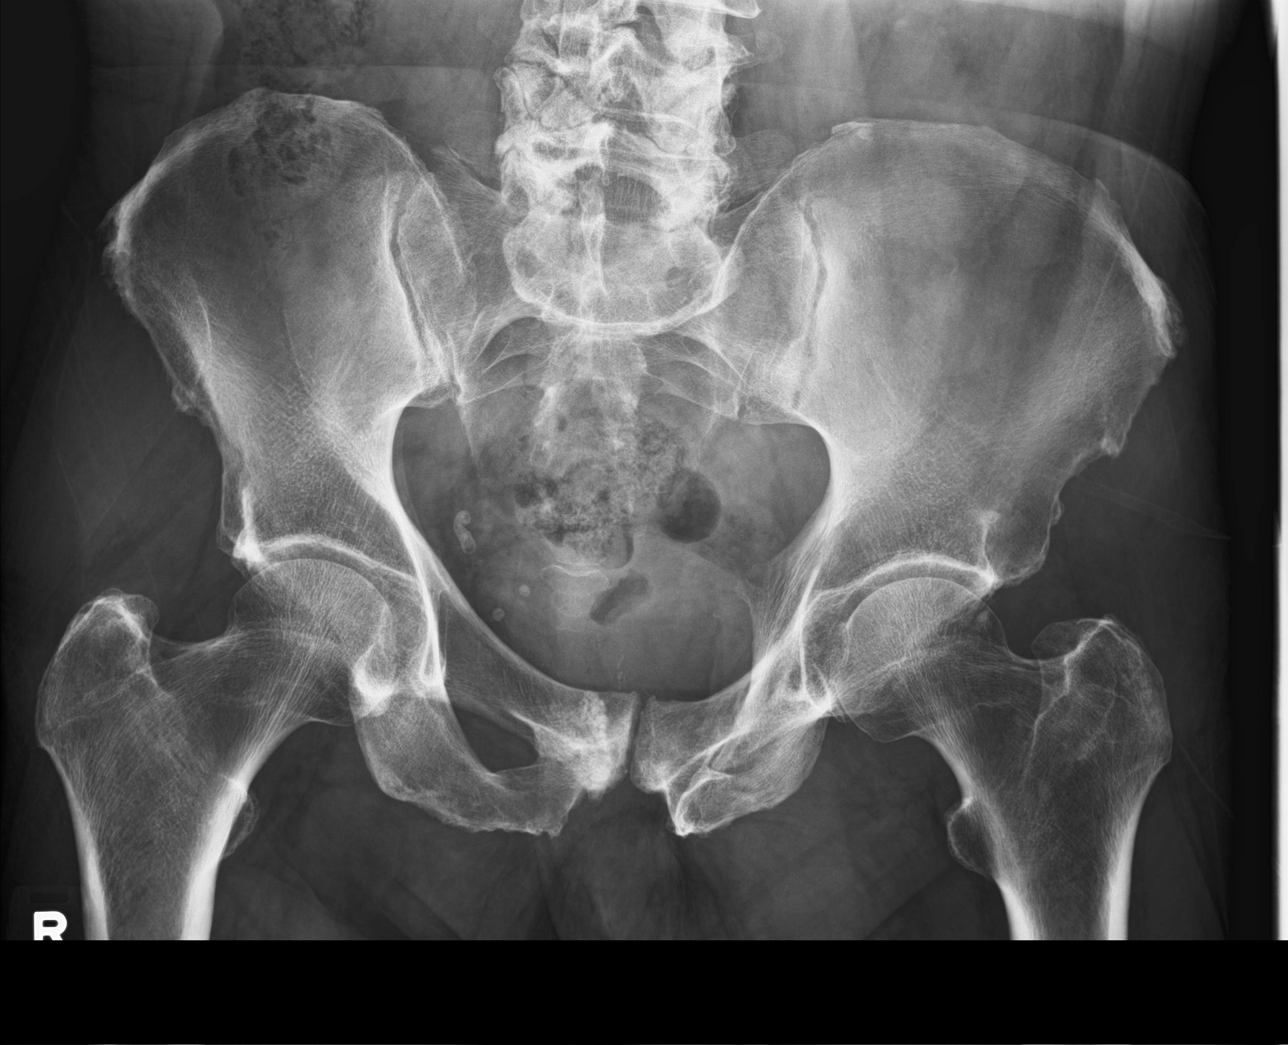

[hip ap]
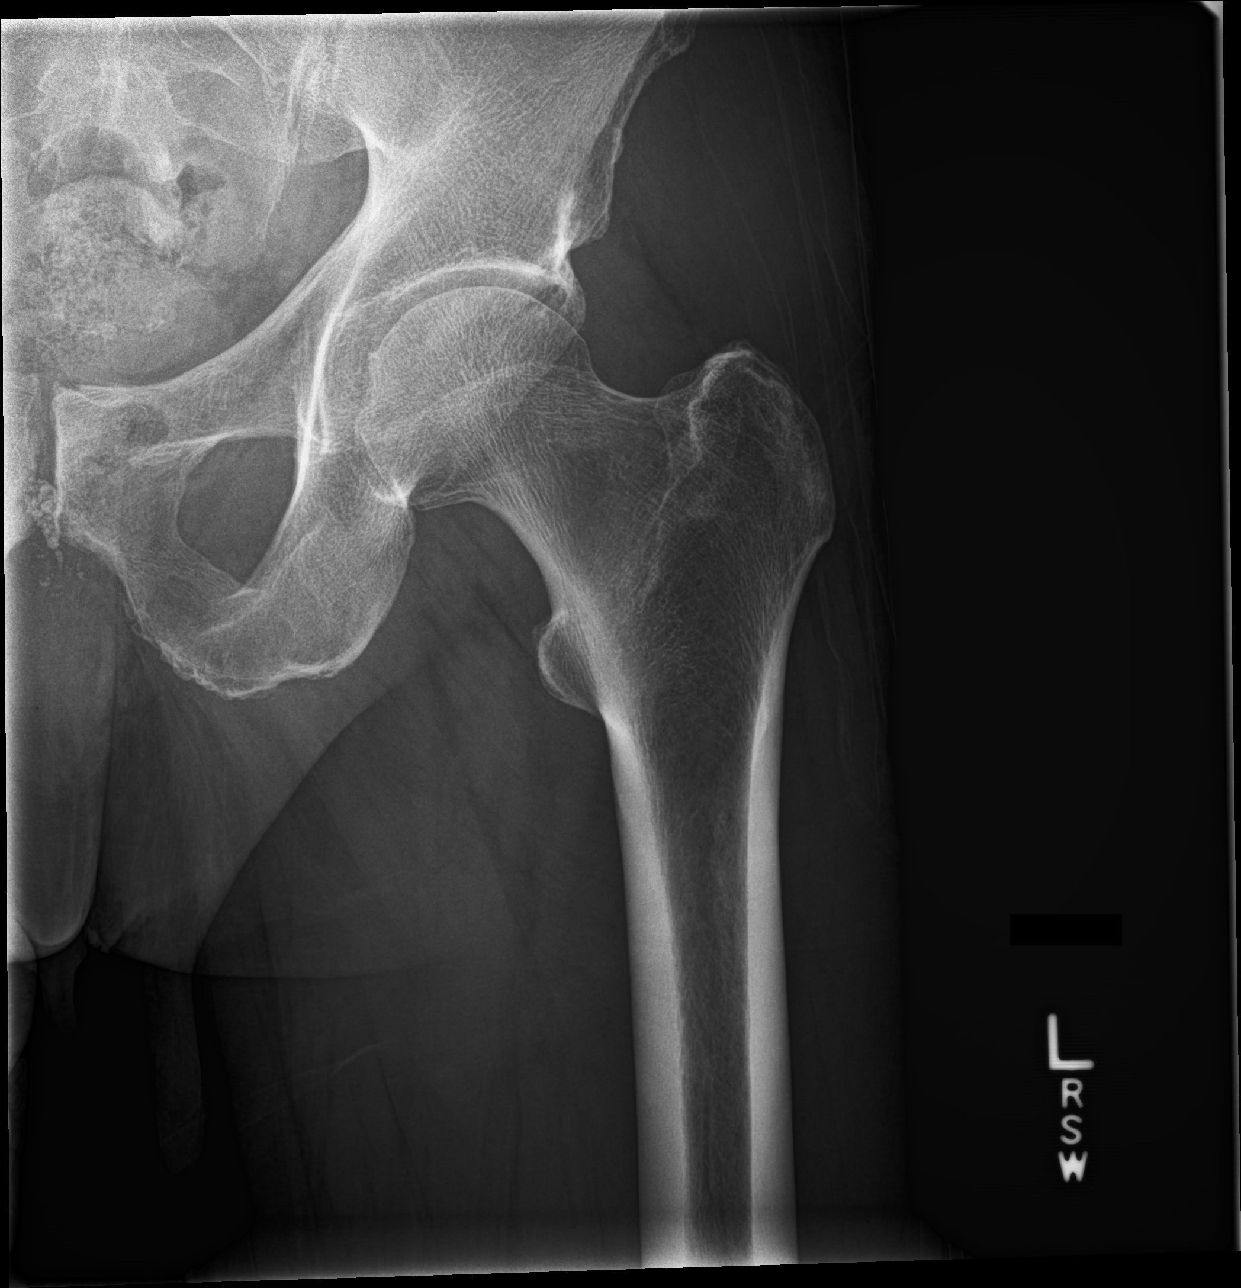

[hip lat]
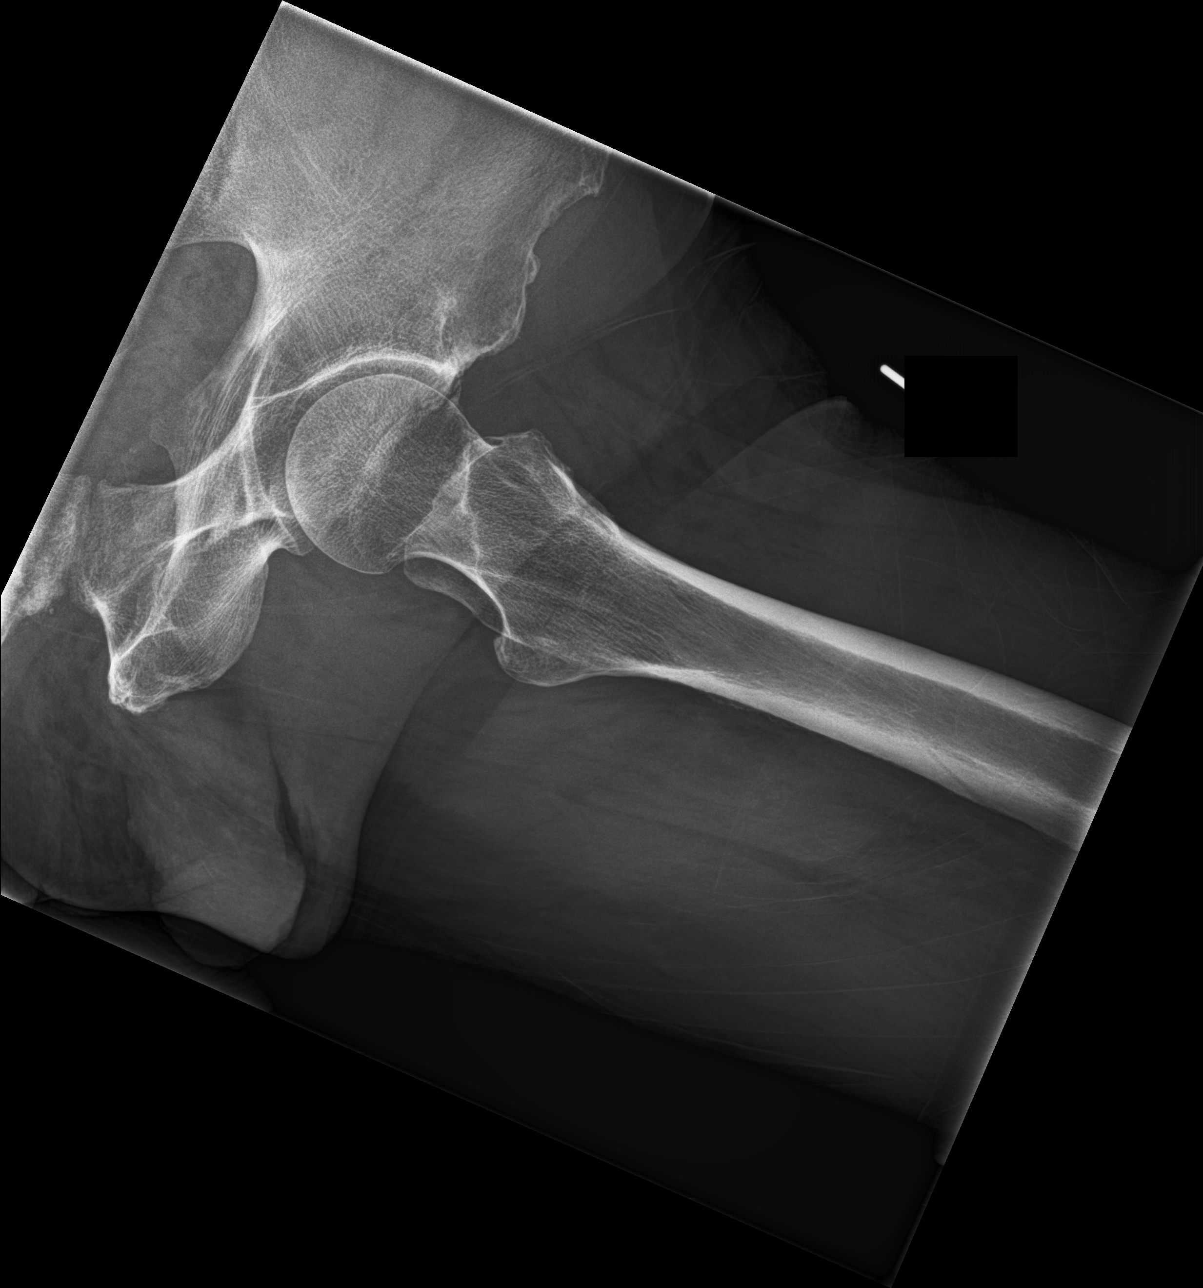

[4 of 4 positions shown; findings below may reference images not displayed]

FINDINGS: No acute fracture or dislocation. No aggressive osseous lesion.
Normal alignment. Left hip joint space is maintained. Lower lumbar
spine spondylosis.

Soft tissue are unremarkable. No radiopaque foreign body or soft
tissue emphysema.
IMPRESSION: No acute osseous injury of the left hip.

## 2022-09-24 ENCOUNTER — Other Ambulatory Visit: Payer: Self-pay | Admitting: Surgery

## 2022-09-24 DIAGNOSIS — R1032 Left lower quadrant pain: Secondary | ICD-10-CM

## 2022-10-17 ENCOUNTER — Other Ambulatory Visit: Payer: Self-pay

## 2022-10-17 ENCOUNTER — Emergency Department
Admission: EM | Admit: 2022-10-17 | Discharge: 2022-10-17 | Disposition: A | Discharge status: Home / Self Care | Payer: Commercial Managed Care - PPO | Attending: Emergency Medicine | Admitting: Emergency Medicine

## 2022-10-17 ENCOUNTER — Encounter: Payer: Self-pay | Admitting: Emergency Medicine

## 2022-10-17 ENCOUNTER — Emergency Department: Payer: Commercial Managed Care - PPO

## 2022-10-17 DIAGNOSIS — N132 Hydronephrosis with renal and ureteral calculous obstruction: Secondary | ICD-10-CM | POA: Diagnosis not present

## 2022-10-17 DIAGNOSIS — R1032 Left lower quadrant pain: Secondary | ICD-10-CM | POA: Diagnosis present

## 2022-10-17 DIAGNOSIS — E119 Type 2 diabetes mellitus without complications: Secondary | ICD-10-CM | POA: Diagnosis not present

## 2022-10-17 DIAGNOSIS — N200 Calculus of kidney: Secondary | ICD-10-CM

## 2022-10-17 DIAGNOSIS — I10 Essential (primary) hypertension: Secondary | ICD-10-CM | POA: Diagnosis not present

## 2022-10-17 LAB — CBC
HCT: 41.4 % (ref 39.0–52.0)
Hemoglobin: 14 g/dL (ref 13.0–17.0)
MCH: 29.7 pg (ref 26.0–34.0)
MCHC: 33.8 g/dL (ref 30.0–36.0)
MCV: 87.9 fL (ref 80.0–100.0)
Platelets: 253 10*3/uL (ref 150–400)
RBC: 4.71 MIL/uL (ref 4.22–5.81)
RDW: 11.9 % (ref 11.5–15.5)
WBC: 13 10*3/uL — ABNORMAL HIGH (ref 4.0–10.5)
nRBC: 0 % (ref 0.0–0.2)

## 2022-10-17 LAB — COMPREHENSIVE METABOLIC PANEL
ALT: 23 U/L (ref 0–44)
AST: 22 U/L (ref 15–41)
Albumin: 4.4 g/dL (ref 3.5–5.0)
Alkaline Phosphatase: 60 U/L (ref 38–126)
Anion gap: 9 (ref 5–15)
BUN: 20 mg/dL (ref 8–23)
CO2: 24 mmol/L (ref 22–32)
Calcium: 9.1 mg/dL (ref 8.9–10.3)
Chloride: 103 mmol/L (ref 98–111)
Creatinine, Ser: 1.28 mg/dL — ABNORMAL HIGH (ref 0.61–1.24)
GFR, Estimated: >60 mL/min (ref >60)
Glucose, Bld: 150 mg/dL — ABNORMAL HIGH (ref 70–99)
Potassium: 4.2 mmol/L (ref 3.5–5.1)
Sodium: 136 mmol/L (ref 135–145)
Total Bilirubin: 1.7 mg/dL — ABNORMAL HIGH (ref 0.3–1.2)
Total Protein: 7.4 g/dL (ref 6.5–8.1)

## 2022-10-17 LAB — URINALYSIS, ROUTINE W REFLEX MICROSCOPIC
Bilirubin Urine: NEGATIVE
Glucose, UA: NEGATIVE mg/dL
Hgb urine dipstick: NEGATIVE
Ketones, ur: NEGATIVE mg/dL
Leukocytes,Ua: NEGATIVE
Nitrite: NEGATIVE
Protein, ur: NEGATIVE mg/dL
Specific Gravity, Urine: 1.026 (ref 1.005–1.030)
pH: 5 (ref 5.0–8.0)

## 2022-10-17 LAB — LIPASE, BLOOD: Lipase: 37 U/L (ref 11–51)

## 2022-10-17 MED ORDER — MORPHINE SULFATE (PF) 4 MG/ML IV SOLN
4.0000 mg | Freq: Once | INTRAVENOUS | Status: AC
  Administered 2022-10-17: 4 mg via INTRAVENOUS
  Filled 2022-10-17: qty 1

## 2022-10-17 MED ORDER — SODIUM CHLORIDE 0.9 % IV BOLUS: 500.0000 mL | Freq: Once | INTRAVENOUS | Status: DC

## 2022-10-17 MED ORDER — KETOROLAC TROMETHAMINE 30 MG/ML IJ SOLN
30.0000 mg | Freq: Once | INTRAMUSCULAR | Status: AC
  Administered 2022-10-17: 30 mg via INTRAVENOUS
  Filled 2022-10-17: qty 1

## 2022-10-17 MED ORDER — METOCLOPRAMIDE HCL 5 MG/ML IJ SOLN
10.0000 mg | Freq: Once | INTRAMUSCULAR | Status: AC
  Administered 2022-10-17: 10 mg via INTRAVENOUS
  Filled 2022-10-17: qty 2

## 2022-10-17 MED ORDER — OXYCODONE-ACETAMINOPHEN 5-325 MG PO TABS: 1.0000 | ORAL_TABLET | Freq: Four times a day (QID) | ORAL | 0 refills | Status: AC | PRN

## 2022-10-17 MED ORDER — TAMSULOSIN HCL 0.4 MG PO CAPS: 0.4000 mg | ORAL_CAPSULE | Freq: Every day | ORAL | 0 refills | Status: AC

## 2022-10-17 MED ORDER — ONDANSETRON 4 MG PO TBDP: 4.0000 mg | ORAL_TABLET | Freq: Three times a day (TID) | ORAL | 0 refills | Status: DC | PRN

## 2022-10-17 MED ORDER — OXYCODONE-ACETAMINOPHEN 5-325 MG PO TABS
1.0000 | ORAL_TABLET | Freq: Once | ORAL | Status: AC
  Administered 2022-10-17: 1 via ORAL
  Filled 2022-10-17: qty 1

## 2022-10-17 MED ORDER — IOHEXOL 300 MG/ML  SOLN
100.0000 mL | Freq: Once | INTRAMUSCULAR | Status: AC | PRN
  Administered 2022-10-17: 100 mL via INTRAVENOUS

## 2022-10-17 MED ORDER — TAMSULOSIN HCL 0.4 MG PO CAPS
0.4000 mg | ORAL_CAPSULE | Freq: Once | ORAL | Status: AC
  Administered 2022-10-17: 0.4 mg via ORAL
  Filled 2022-10-17: qty 1

## 2022-10-17 NOTE — ED Triage Notes (Signed)
Patient to ED via POV for abd pain on right side. Patient states he has a known hernia on that side and also a history of kidney stones. Patient states some difficulty urinating but pain does not radiate into back.

## 2022-10-17 NOTE — Discharge Instructions (Addendum)
Your exam and labs overall reassuring.  Your CT did reveal a 6 mm stone in the right ureter.  You should able to pass the stone with out difficulty over the next week.  Take the prescription meds as directed.  Follow-up with urology as discussed.  Return to the ED if necessary.

## 2022-10-17 NOTE — ED Provider Notes (Signed)
[facility] Provider Note    Event Date/Time   First MD Initiated Contact with Patient 10/17/22 1817     (approximate)   History   Abdominal Pain   HPI  Brandon Moon is a 66 y.o. male with a past medical history of left lower quadrant inguinal hernia, BPH, diabetes, hyperlipidemia, hypertension, kidney stones who presents today for evaluation of left lower quadrant pain.  Reports that this pain began 3 days ago.  He has a known hernia in that location.  He has not noticed any skin color changes.  He reports that he had 1 episode of vomiting yesterday, and 1 episode of vomiting today.  He reports that he has had some constipation, but this is normal for him.  He denies any testicular pain or swelling.  He has not had any dysuria or hematuria.  No fevers or chills.  He has not noticed any lumps in the area.  Dr. Lysle Pearl with general surgery has ordered a CT for him, the patient has not yet had this.  There are no problems to display for this patient.         Physical Exam   Triage Vital Signs: ED Triage Vitals  Enc Vitals Group     BP 10/17/22 1509 135/78     Pulse Rate 10/17/22 1509 70     Resp 10/17/22 1509 18     Temp 10/17/22 1509 99.2 F (37.3 C)     Temp Source 10/17/22 1509 Oral     SpO2 10/17/22 1509 94 %     Weight --      Height --      Head Circumference --      Peak Flow --      Pain Score 10/17/22 1512 10     Pain Loc --      Pain Edu? --      Excl. in [place]? --     Most recent vital signs: Vitals:   10/17/22 1509  BP: 135/78  Pulse: 70  Resp: 18  Temp: 99.2 F (37.3 C)  SpO2: 94%    Physical Exam Vitals and nursing note reviewed.  Constitutional:      General: Awake and alert. No acute distress.    Appearance: Normal appearance. The patient is normal weight.  HENT:     Head: Normocephalic and atraumatic.     Mouth: Mucous membranes are moist.  Eyes:     General: PERRL. Normal EOMs        Right eye: No discharge.         Left eye: No discharge.     Conjunctiva/sclera: Conjunctivae normal.  Cardiovascular:     Rate and Rhythm: Normal rate and regular rhythm.     Pulses: Normal pulses.  Pulmonary:     Effort: Pulmonary effort is normal. No respiratory distress.     Breath sounds: Normal breath sounds.  Abdominal:     Abdomen is soft. There is left lower quadrant abdominal tenderness. No rebound or guarding. No distention.  No overlying skin color changes.  No bulges noted. Normal GU exam Musculoskeletal:        General: No swelling. Normal range of motion.     Cervical back: Normal range of motion and neck supple.  Skin:    General: Skin is warm and dry.     Capillary Refill: Capillary refill takes less than 2 seconds.     Findings: No rash.  Neurological:  Mental Status: The patient is awake and alert.      ED Results / Procedures / Treatments   Labs (all labs ordered are listed, but only abnormal results are displayed) Labs Reviewed  COMPREHENSIVE METABOLIC PANEL - Abnormal; Notable for the following components:      Result Value   Glucose, Bld 150 (*)    Creatinine, Ser 1.28 (*)    Total Bilirubin 1.7 (*)    All other components within normal limits  CBC - Abnormal; Notable for the following components:   WBC 13.0 (*)    All other components within normal limits  URINALYSIS, ROUTINE W REFLEX MICROSCOPIC - Abnormal; Notable for the following components:   Color, Urine YELLOW (*)    APPearance CLEAR (*)    All other components within normal limits  LIPASE, BLOOD     EKG     RADIOLOGY Pending at the time of pass-off    PROCEDURES:  Critical Care performed:   Procedures   MEDICATIONS ORDERED IN ED: Medications  morphine (PF) 4 MG/ML injection 4 mg (has no administration in time range)     IMPRESSION / MDM / ASSESSMENT AND PLAN / ED COURSE  I reviewed the triage vital signs and the nursing notes.   Differential diagnosis includes, but is not limited to,  recurrence of hernia, strangulated hernia, incarcerated hernia, diverticulitis, nephrolithiasis.  Patient is awake and alert, hemodynamically stable and afebrile.  He is uncomfortable in appearance, though answering questions appropriately.  I reviewed the patient's chart, had a recent visit with Dr. Lysle Pearl who is ordered a CT scan for the same problem, however patient has not yet had this test done.  His labs/urine obtained in triage are normal.  CT abdomen and pelvis ordered for further evaluation.  Patient passed off to [facility], PA-C when she began awaiting CT and final disposition.   Patient's presentation is most consistent with acute complicated illness / injury requiring diagnostic workup.     FINAL CLINICAL IMPRESSION(S) / ED DIAGNOSES   Final diagnoses:  LLQ abdominal pain     Rx / DC Orders   ED Discharge Orders     None        Note:  This document was prepared using Dragon voice recognition software and may include unintentional dictation errors.   Emeline Gins 10/17/22 1918    Lucillie Garfinkel, MD 10/17/22 2025

## 2022-10-17 NOTE — ED Provider Notes (Signed)
----------------------------------------- 8:27 PM on 10/17/2022 -----------------------------------------  Blood pressure 129/79, pulse 70, temperature 98.1 F (36.7 C), temperature source Oral, resp. rate 20, SpO2 95 %.  Assuming care from Dr. Sheran Luz, PA-C/NP-C.  In short, Brandon Moon is a 66 y.o. male with a chief complaint of Abdominal Pain .  Refer to the original H&P for additional details.  The current plan of care is to await CT scan results and disposition accordingly.  ____________________________________________    ED Results / Procedures / Treatments   Labs (all labs ordered are listed, but only abnormal results are displayed) Labs Reviewed  COMPREHENSIVE METABOLIC PANEL - Abnormal; Notable for the following components:      Result Value   Glucose, Bld 150 (*)    Creatinine, Ser 1.28 (*)    Total Bilirubin 1.7 (*)    All other components within normal limits  CBC - Abnormal; Notable for the following components:   WBC 13.0 (*)    All other components within normal limits  URINALYSIS, ROUTINE W REFLEX MICROSCOPIC - Abnormal; Notable for the following components:   Color, Urine YELLOW (*)    APPearance CLEAR (*)    All other components within normal limits  LIPASE, BLOOD     EKG    RADIOLOGY  I personally viewed and evaluated these images as part of my medical decision making, as well as reviewing the written report by the radiologist.  ED Provider Interpretation: left renal calculi}  CT Abdomen Pelvis W Contrast  Result Date: 10/17/2022 CLINICAL DATA:  Right lower quadrant abdominal pain. EXAM: CT ABDOMEN AND PELVIS WITH CONTRAST TECHNIQUE: Multidetector CT imaging of the abdomen and pelvis was performed using the standard protocol following bolus administration of intravenous contrast. RADIATION DOSE REDUCTION: This exam was performed according to the departmental dose-optimization program which includes automated exposure control, adjustment of  the mA and/or kV according to patient size and/or use of iterative reconstruction technique. CONTRAST:  111mL OMNIPAQUE IOHEXOL 300 MG/ML  SOLN COMPARISON:  08/16/2020. FINDINGS: Lower chest: Normal sized heart.  Clear lung bases. Hepatobiliary: Stable 2 small liver cysts. These do not need imaging follow-up. Unremarkable gallbladder. Pancreas: Unremarkable. No pancreatic ductal dilatation or surrounding inflammatory changes. Spleen: Normal in size without focal abnormality. Adrenals/Urinary Tract: Normal appearing adrenal glands. Interval moderate dilatation of the left renal collecting system and proximal ureter to the level of a 6 mm proximal ureteral calculus. This is not visible on the scout image. Associated delayed concentration of contrast in the left kidney and mild left perinephric soft tissue stranding and edema with indistinct margins. Mm lower pole left renal calculus. The remainder of the left ureter has a normal appearance as do the urinary bladder and right ureter. Tiny upper pole right renal cyst is unchanged. This does not need imaging follow-up. Stomach/Bowel: Minimal sigmoid colon diverticulosis without evidence of diverticulitis. Unremarkable stomach and small bowel. Surgically absent appendix. Vascular/Lymphatic: No significant vascular findings are present. No enlarged abdominal or pelvic lymph nodes. Reproductive: Mildly to moderately enlarged prostate gland containing coarse calcifications. Other: Tiny umbilical hernia containing fat. Musculoskeletal: Lumbar and lower thoracic spine degenerative changes. IMPRESSION: 1. 6 mm proximal left ureteral calculus causing moderate left hydronephrosis and proximal left hydroureter. 2. 3 mm nonobstructing lower pole left renal calculus. 3. Minimal sigmoid colon diverticulosis. Electronically Signed   By: Claudie Revering M.D.   On: 10/17/2022 20:08     PROCEDURES:  Critical Care performed: No  Procedures   MEDICATIONS ORDERED IN ED: Medications  morphine (PF) 4 MG/ML injection 4 mg (4 mg Intravenous Given 10/17/22 1928)  iohexol (OMNIPAQUE) 300 MG/ML solution 100 mL (100 mLs Intravenous Contrast Given 10/17/22 1937)  ketorolac (TORADOL) 30 MG/ML injection 30 mg (30 mg Intravenous Given 10/17/22 2033)  tamsulosin (FLOMAX) capsule 0.4 mg (0.4 mg Oral Given 10/17/22 2034)  oxyCODONE-acetaminophen (PERCOCET/ROXICET) 5-325 MG per tablet 1 tablet (1 tablet Oral Given 10/17/22 2034)  metoCLOPramide (REGLAN) injection 10 mg (10 mg Intravenous Given 10/17/22 2033)     IMPRESSION / MDM / ASSESSMENT AND PLAN / ED COURSE  I reviewed the triage vital signs and the nursing notes.                              Differential diagnosis includes, but is not limited to, acute appendicitis, renal colic, testicular torsion, urinary tract infection/pyelonephritis, prostatitis,  epididymitis, diverticulitis, small bowel obstruction or ileus, colitis, abdominal aortic aneurysm, gastroenteritis, hernia, etc. ,  Patient's presentation is most consistent with acute complicated illness / injury requiring diagnostic workup.  Patient to the ED for evaluation of left lower quadrant abdominal pain, with concern for an inguinal hernia.  Evaluate for his complaints in the ED, found have reassuring exam overall.  CT scan showed no evidence of an inguinal hernia.  No evidence of diverticulitis on exam but he did however, had evidence of left sided ureteral calculi measuring 6 mm with some associated hydronephrosis.  Patient's labs show a mild diagnosis is consistent with ureteral calculi. Patient will be discharged home with prescriptions for Flomax, oxycodone, Zofran. Patient is to follow up with urology as needed or otherwise directed. Patient is given ED precautions to return to the ED for any worsening or new symptoms.  Clinical Course as of 10/18/22 0024  Brandon Moon Oct 17, 2022  1925 Passed off to J. Modesta Messing PA-C pending CT and final disposition [JP]    Clinical Course  User Index [JP] Poggi, Clarnce Flock, PA-C    FINAL CLINICAL IMPRESSION(S) / ED DIAGNOSES   Final diagnoses:  LLQ abdominal pain  Kidney stones     Rx / DC Orders   ED Discharge Orders          Ordered    tamsulosin (FLOMAX) 0.4 MG CAPS capsule  Daily after supper        10/17/22 2026    oxyCODONE-acetaminophen (PERCOCET) 5-325 MG tablet  Every 6 hours PRN        10/17/22 2026    ondansetron (ZOFRAN-ODT) 4 MG disintegrating tablet  Every 8 hours PRN        10/17/22 2026             Note:  This document was prepared using Dragon voice recognition software and may include unintentional dictation errors.    Melvenia Needles, PA-C 10/18/22 0024    Merlyn Lot, MD 10/24/22 1102

## 2022-10-17 NOTE — ED Notes (Signed)
Called for triage x1, no answer 

## 2022-10-17 NOTE — ED Notes (Signed)
Called for triage x2, no answer.

## 2022-10-30 ENCOUNTER — Ambulatory Visit
Admission: RE | Admit: 2022-10-30 | Discharge: 2022-10-30 | Disposition: A | Discharge status: Home / Self Care | Payer: Commercial Managed Care - PPO | Attending: Urology | Admitting: Urology

## 2022-10-30 ENCOUNTER — Encounter: Payer: Self-pay | Admitting: Urology

## 2022-10-30 ENCOUNTER — Encounter
Admission: RE | Disposition: A | Discharge status: Home / Self Care | Payer: Self-pay | Source: Home / Self Care | Attending: Urology

## 2022-10-30 DIAGNOSIS — Z7984 Long term (current) use of oral hypoglycemic drugs: Secondary | ICD-10-CM | POA: Diagnosis not present

## 2022-10-30 DIAGNOSIS — N200 Calculus of kidney: Secondary | ICD-10-CM | POA: Diagnosis present

## 2022-10-30 DIAGNOSIS — E119 Type 2 diabetes mellitus without complications: Secondary | ICD-10-CM | POA: Diagnosis not present

## 2022-10-30 DIAGNOSIS — I10 Essential (primary) hypertension: Secondary | ICD-10-CM | POA: Insufficient documentation

## 2022-10-30 DIAGNOSIS — N201 Calculus of ureter: Secondary | ICD-10-CM

## 2022-10-30 HISTORY — PX: EXTRACORPOREAL SHOCK WAVE LITHOTRIPSY: SHX1557

## 2022-10-30 SURGERY — LITHOTRIPSY, ESWL
Anesthesia: Moderate Sedation | Laterality: Left

## 2022-10-30 MED ORDER — MIDAZOLAM HCL 2 MG/2ML IJ SOLN: 1.0000 mg | Freq: Once | INTRAMUSCULAR | Status: DC

## 2022-10-30 MED ORDER — CIPROFLOXACIN HCL 500 MG PO TABS: 500.0000 mg | ORAL_TABLET | Freq: Two times a day (BID) | ORAL | 1 refills | Status: DC

## 2022-10-30 MED ORDER — PROMETHAZINE HCL 25 MG/ML IJ SOLN: 25.0000 mg | Freq: Once | INTRAMUSCULAR | Status: DC

## 2022-10-30 MED ORDER — MORPHINE SULFATE (PF) 10 MG/ML IV SOLN: 10.0000 mg | Freq: Once | INTRAVENOUS | Status: DC

## 2022-10-30 MED ORDER — PROMETHAZINE HCL 25 MG/ML IJ SOLN
INTRAMUSCULAR | Status: AC
  Filled 2022-10-30: qty 1

## 2022-10-30 MED ORDER — MIDAZOLAM HCL 2 MG/2ML IJ SOLN
INTRAMUSCULAR | Status: AC
  Filled 2022-10-30: qty 2

## 2022-10-30 MED ORDER — LEVOFLOXACIN 500 MG PO TABS: 500.0000 mg | ORAL_TABLET | Freq: Every day | ORAL | Status: DC

## 2022-10-30 MED ORDER — MORPHINE SULFATE (PF) 10 MG/ML IV SOLN
INTRAVENOUS | Status: AC
  Filled 2022-10-30: qty 1

## 2022-10-30 MED ORDER — FUROSEMIDE 10 MG/ML IJ SOLN: 10.0000 mg | Freq: Once | INTRAMUSCULAR | Status: AC

## 2022-10-30 MED ORDER — DEXTROSE-NACL 5-0.45 % IV SOLN: INTRAVENOUS | Status: DC

## 2022-10-30 MED ORDER — LEVOFLOXACIN 500 MG PO TABS
ORAL_TABLET | ORAL | Status: AC
  Administered 2022-10-30: 500 mg via ORAL
  Filled 2022-10-30: qty 1

## 2022-10-30 MED ORDER — DIPHENHYDRAMINE HCL 25 MG PO CAPS: 25.0000 mg | ORAL_CAPSULE | Freq: Once | ORAL | Status: DC

## 2022-10-30 MED ORDER — DIPHENHYDRAMINE HCL 25 MG PO CAPS
ORAL_CAPSULE | ORAL | Status: AC
  Filled 2022-10-30: qty 1

## 2022-10-30 MED ORDER — FUROSEMIDE 10 MG/ML IJ SOLN
INTRAMUSCULAR | Status: AC
  Administered 2022-10-30: 10 mg via INTRAVENOUS
  Filled 2022-10-30: qty 2

## 2022-10-30 NOTE — Discharge Instructions (Addendum)
ESWL for Kidney Stones, Care After The following information offers guidance on how to care for yourself after your procedure. Your health care provider may also give you more specific instructions. If you have problems or questions, contact your health care provider. What can I expect after the procedure? After the procedure, it is common to have: Some blood in your urine. This should only last for a few days. Soreness in your back, sides, or upper abdomen for a few days. Blotches or bruises on the area where the shock wave entered the skin. Pain, discomfort, or nausea when pieces (fragments) of the kidney stone move through the tube that carries urine from the kidney to the bladder (ureter). Fragments may pass soon after the procedure. They may also take up to 4-8 weeks to pass. If you have severe pain or nausea, contact your health care provider. This may be caused by a large stone that was not broken up enough. This may mean that you need more treatment. Some pain or discomfort during urination. Some pain or discomfort in the lower abdomen or at the base of the penis. Follow these instructions at home: Medicines  Take over-the-counter and prescription medicines only as told by your health care provider. If you were prescribed antibiotics, take them as told by your health care provider. Do not stop using the antibiotic even if you start to feel better. Ask your health care provider if the medicine prescribed to you: Requires you to avoid driving or using machinery. Can cause constipation. You may need to take these actions to prevent or treat constipation: Take over-the-counter or prescription medicines. Eat foods that are high in fiber, such as beans, whole grains, and fresh fruits and vegetables. Limit foods that are high in fat and processed sugars, such as fried or sweet foods. Eating and drinking  Follow instructions from your health care provider about what you may eat and drink. You  may be told to: Reduce how much salt (sodium) you eat or drink. Check ingredients and nutrition facts on packaged foods and drinks to see how much sodium they contain. Reduce how much meat you eat. Drink enough fluid to keep your urine pale yellow. This can help you pass any pieces of the stone that are left. It can also prevent new stones from forming. Eat plenty of fresh fruits and vegetables. Eat the recommended amount of calcium for your age and gender. Ask your health care provider how much calcium you should have. Activity Get plenty of rest as told by your health care provider. Avoid sitting for a long time without moving. Get up to take short walks every 1-2 hours. This is important to improve blood flow and breathing. Ask for help if you feel weak or unsteady. Your health care provider may tell you to lie in a certain position (postural drainage) and tap firmly (percuss) over your kidney area to help stone fragments pass. Follow instructions as told by your health care provider. Return to your normal activities as told by your health care provider. Ask your health care provider what activities are safe for you. Most people can resume normal activities 1-2 days after the procedure. General instructions If told, strain all urine through the strainer that was provided by your health care provider. Keep all fragments for your health care provider to see. Any stones that are found may be sent to a medical lab for examination. The stone may be as small as a grain of salt. Keep all follow-up   visits. This is important if you had a stent placed because it may need to stay in place for a few weeks. Ask your health care provider when the stent will be removed. Contact a health care provider if: You have a fever or chills. You have severe nausea that leads to persistent vomiting. You have any of these urinary symptoms: Increased blood or blood clots in the urine. Urine that smells bad or unusual. A  strong urge to urinate after emptying your bladder. Pain or burning with urination that does not go away. A continued need to urinate more often than usual. You have a stent, and it comes out. Get help right away if: You have severe pain in your back, sides, or upper abdomen. You faint. You have any of these urinary symptoms: Severe pain while urinating. More blood in your urine, or blood in your urine when you did not have any before. Blood clots in your urine larger than 1 inch (2.5 cm) in size. You pass only a small amount of urine when you urinate or are unable to pass any urine. This information is not intended to replace advice given to you by your health care provider. Make sure you discuss any questions you have with your health care provider. Document Revised: 04/03/2022 Document Reviewed: 04/03/2022 Elsevier Patient Education  2023 [employer]   AMBULATORY SURGERY  DISCHARGE INSTRUCTIONS   The drugs that you were given will stay in your system until tomorrow so for the next 24 hours you should not:  Drive an automobile Make any legal decisions Drink any alcoholic beverage   You may resume regular meals tomorrow.  Today it is better to start with liquids and gradually work up to solid foods.  You may eat anything you prefer, but it is better to start with liquids, then soup and crackers, and gradually work up to solid foods.   Please notify your doctor immediately if you have any unusual bleeding, trouble breathing, redness and pain at the surgery site, drainage, fever, or pain not relieved by medication.    Additional Instructions:        Please contact your physician with any problems or Same Day Surgery at [phone], Monday through Friday 6 am to 4 pm, or  at [facility] number at [phone].

## 2023-02-25 ENCOUNTER — Encounter: Payer: Self-pay | Admitting: Podiatry

## 2023-02-25 ENCOUNTER — Ambulatory Visit: Payer: Medicare HMO | Admitting: Podiatry

## 2023-02-25 ENCOUNTER — Ambulatory Visit (INDEPENDENT_AMBULATORY_CARE_PROVIDER_SITE_OTHER): Payer: Self-pay

## 2023-02-25 DIAGNOSIS — M722 Plantar fascial fibromatosis: Secondary | ICD-10-CM

## 2023-02-25 MED ORDER — MELOXICAM 15 MG PO TABS: 15.0000 mg | ORAL_TABLET | Freq: Every day | ORAL | 3 refills | Status: DC

## 2023-02-25 MED ORDER — METHYLPREDNISOLONE 4 MG PO TBPK: ORAL_TABLET | ORAL | 0 refills | Status: DC

## 2023-02-25 MED ORDER — TRIAMCINOLONE ACETONIDE 40 MG/ML IJ SUSP
20.0000 mg | Freq: Once | INTRAMUSCULAR | Status: AC
  Administered 2023-02-25: 20 mg

## 2023-02-25 NOTE — Patient Instructions (Signed)

## 2023-02-25 NOTE — Progress Notes (Signed)
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   New Patient (Initial Visit)    67 y.o. male presents with the above complaint.   ROS: Denies fever chills nausea vomit muscle aches pains calf pain back pain chest pain shortness of breath.  Past Medical History:  Diagnosis Date   BPH (benign prostatic hyperplasia)    Diabetes mellitus without complication ([place]) 0000000   Glaucoma    Hernia of abdominal wall    High cholesterol    History of kidney stones 2013   eswl at that time   Pre-diabetes    Past Surgical History:  Procedure Laterality Date   APPENDECTOMY     COLONOSCOPY     EXTRACORPOREAL SHOCK WAVE LITHOTRIPSY  2013   [place] LITHOTRIPSY Left 08/06/2017   Procedure: EXTRACORPOREAL SHOCK WAVE LITHOTRIPSY (ESWL);  Surgeon: Royston Cowper, MD;  Location: [facility] ORS;  Service: Urology;  Laterality: Left;   EXTRACORPOREAL SHOCK WAVE LITHOTRIPSY Left 10/30/2022   Procedure: EXTRACORPOREAL SHOCK WAVE LITHOTRIPSY (ESWL);  Surgeon: Royston Cowper, MD;  Location: [facility] ORS;  Service: Urology;  Laterality: Left;   EYE SURGERY     HERNIA REPAIR     XI ROBOTIC ASSISTED INGUINAL HERNIA REPAIR WITH MESH Left 08/19/2019   Procedure: XI ROBOTIC ASSISTED LAPARASCOPIC LEFT INGUINAL HERNIA REPAIR;  Surgeon: Benjamine Sprague, DO;  Location: [facility] ORS;  Service: General;  Laterality: Left;    Current Outpatient Medications:    meloxicam (MOBIC) 15 MG tablet, Take 1 tablet (15 mg total) by mouth daily., Disp: 30 tablet, Rfl: 3   methylPREDNISolone (MEDROL DOSEPAK) 4 MG TBPK tablet, 6 day dose pack - take as directed, Disp: 21 tablet, Rfl: 0   aspirin EC  81 MG tablet, Take 81 mg by mouth daily., Disp: , Rfl:    atorvastatin (LIPITOR) 10 MG tablet, Take 10 mg by mouth daily at 6 PM. , Disp: , Rfl:    brimonidine (ALPHAGAN) 0.2 % ophthalmic solution, Place 1 drop into both eyes 2 (two) times daily., Disp: , Rfl:    citalopram (CELEXA) 20 MG tablet, Take by mouth., Disp: , Rfl:    dorzolamide (TRUSOPT) 2 % ophthalmic solution, Place 1 drop into both eyes 2 (two) times daily., Disp: , Rfl:    finasteride (PROSCAR) 5 MG tablet, Take 5 mg by mouth daily. , Disp: , Rfl:    latanoprost (XALATAN) 0.005 % ophthalmic solution, Place 1 drop into both eyes at bedtime., Disp: , Rfl:    losartan (COZAAR) 25 MG tablet, Take 25 mg by mouth daily., Disp: , Rfl:    metFORMIN (GLUCOPHAGE) 500 MG tablet, Take 500 mg by mouth daily with breakfast. , Disp: , Rfl:   Allergies  Allergen Reactions   Aspirin     Hx of stomach ulcers   Review of Systems Objective:  There were no vitals filed for this visit.  General: Well developed, nourished, in no acute distress, alert and oriented x3   Dermatological: Skin is warm, dry and supple bilateral. Nails x 10 are well maintained; remaining integument appears unremarkable at this time. There are  no open sores, no preulcerative lesions, no rash or signs of infection present.  Vascular: Dorsalis Pedis artery and Posterior Tibial artery pedal pulses are 2/4 bilateral with immedate capillary fill time. Pedal hair growth present. No varicosities and no lower extremity edema present bilateral.   Neruologic: Grossly intact via light touch bilateral. Vibratory intact via tuning fork bilateral. Protective threshold with Semmes Wienstein monofilament intact to all pedal sites bilateral. Patellar and Achilles deep tendon reflexes 2+ bilateral. No Babinski or clonus noted bilateral.   Musculoskeletal: No gross boney pedal deformities bilateral. No pain, crepitus, or limitation noted with foot and ankle range of motion bilateral.  Muscular strength 5/5 in all groups tested bilateral.  Pain on palpation medial calcaneal tubercle of the left heel.  Gait: Unassisted, Nonantalgic.    Radiographs:  Radiographs taken today demonstrate osseously mature individual soft tissue increase in density plantar fashion calcaneal insertion site of the heel no acute findings in the area of tenderness.  Assessment & Plan:   Assessment: Planter fasciitis left heel.  Plan: Discussed etiology pathology conservative surgical therapies.  Discussed appropriate shoe gear stretching exercise ice therapy and shoe gear modifications.  I injected the left heel today 20 mg Kenalog 5 mg Marcaine point maximal tenderness.  Also started him on methyl prednisone pack 6 days to be followed by meloxicam for the next 30 days.  I would like to follow-up with him in 1 month questions or concerns he will notify us immediately I did warn him his blood sugar increase so he knows how to compensate for that.     Birl Lobello T. Re[place], [place]

## 2023-04-06 ENCOUNTER — Ambulatory Visit: Payer: Medicare HMO | Admitting: Podiatry

## 2023-05-06 ENCOUNTER — Encounter: Payer: Self-pay | Admitting: Podiatry

## 2023-05-06 ENCOUNTER — Ambulatory Visit: Payer: Medicare HMO | Admitting: Podiatry

## 2023-05-06 DIAGNOSIS — M722 Plantar fascial fibromatosis: Secondary | ICD-10-CM | POA: Diagnosis not present

## 2023-05-06 MED ORDER — MELOXICAM 15 MG PO TABS: 15.0000 mg | ORAL_TABLET | Freq: Every day | ORAL | 3 refills | Status: DC

## 2023-05-06 NOTE — Progress Notes (Signed)
He presents today for follow-up of his Planter fasciitis left he states that he is approximately 80% improved continues to take his meloxicam on a regular basis states he has had his last refill filled.  States that he continues to improve all the time states that he has recently started to develop pain in his gluteal regions and down the backside of his thigh particularly when he is try to get in bed at nighttime is exquisitely painful.  Objective: Vital signs stable he is alert and oriented x 3.  Pulses are palpable.  There is no erythema edema salines drainage odor minimal tenderness on palpation MucoClear tubercle left.  Assessment: Resolving Planter fasciitis.  Cannot rule out piriformis syndrome cannot rule out sciatica.  Plan: At this point recommended that he continue his meloxicam which we refilled today continue the use of his Planter fascial brace and tennis shoes.  Follow-up with me with any residual Planter fasciitis or recurrence.

## 2023-06-17 ENCOUNTER — Other Ambulatory Visit: Payer: Self-pay | Admitting: Podiatry

## 2024-01-10 ENCOUNTER — Other Ambulatory Visit: Payer: Self-pay | Admitting: Podiatry

## 2024-08-21 ENCOUNTER — Other Ambulatory Visit: Payer: Self-pay | Admitting: Podiatry
